# Patient Record
Sex: Female | Born: 1990 | Hispanic: No | Marital: Single | Smoking: Current every day smoker
Health system: Southern US, Community
[De-identification: ages and names within clinical notes are randomized; demographics above are authoritative.]

## PROBLEM LIST (undated history)

## (undated) DIAGNOSIS — F419 Anxiety disorder, unspecified: Secondary | ICD-10-CM

## (undated) DIAGNOSIS — J020 Streptococcal pharyngitis: Secondary | ICD-10-CM

## (undated) DIAGNOSIS — F1910 Other psychoactive substance abuse, uncomplicated: Secondary | ICD-10-CM

## (undated) DIAGNOSIS — A5901 Trichomonal vulvovaginitis: Secondary | ICD-10-CM

## (undated) DIAGNOSIS — N179 Acute kidney failure, unspecified: Secondary | ICD-10-CM

## (undated) HISTORY — PX: DILATION AND CURETTAGE OF UTERUS: SHX78

---
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
## 2009-11-25 ENCOUNTER — Emergency Department (HOSPITAL_COMMUNITY): Admission: EM | Admit: 2009-11-25 | Discharge: 2009-11-25 | Payer: Self-pay | Admitting: Emergency Medicine

## 2010-01-28 ENCOUNTER — Encounter (INDEPENDENT_AMBULATORY_CARE_PROVIDER_SITE_OTHER): Payer: Self-pay | Admitting: Cardiology

## 2010-01-28 ENCOUNTER — Ambulatory Visit (HOSPITAL_COMMUNITY): Admission: RE | Admit: 2010-01-28 | Discharge: 2010-01-28 | Payer: Self-pay | Admitting: Cardiology

## 2010-06-26 ENCOUNTER — Inpatient Hospital Stay (HOSPITAL_COMMUNITY)
Admission: AD | Admit: 2010-06-26 | Discharge: 2010-06-26 | Payer: Self-pay | Source: Home / Self Care | Attending: Obstetrics | Admitting: Obstetrics

## 2010-08-01 ENCOUNTER — Inpatient Hospital Stay (HOSPITAL_COMMUNITY)
Admission: AD | Admit: 2010-08-01 | Discharge: 2010-08-01 | Payer: Self-pay | Source: Home / Self Care | Admitting: Obstetrics

## 2010-08-15 ENCOUNTER — Inpatient Hospital Stay (HOSPITAL_COMMUNITY)
Admission: AD | Admit: 2010-08-15 | Discharge: 2010-08-17 | DRG: 774 | Disposition: A | Discharge status: Home / Self Care | Payer: Medicaid Other | Source: Ambulatory Visit | Attending: Obstetrics | Admitting: Obstetrics

## 2010-08-15 LAB — CBC
HCT: 32.6 % — ABNORMAL LOW (ref 36.0–46.0)
Hemoglobin: 11.1 g/dL — ABNORMAL LOW (ref 12.0–15.0)
MCHC: 34 g/dL (ref 30.0–36.0)
MCV: 89.8 fL (ref 78.0–100.0)
Platelets: 208 10*3/uL (ref 150–400)
RBC: 3.63 MIL/uL — ABNORMAL LOW (ref 3.87–5.11)
RDW: 13 % (ref 11.5–15.5)
WBC: 12.3 10*3/uL — ABNORMAL HIGH (ref 4.0–10.5)

## 2010-08-15 LAB — RPR: RPR Ser Ql: NONREACTIVE

## 2010-08-16 LAB — CBC
MCH: 31.2 pg (ref 26.0–34.0)
MCHC: 34.1 g/dL (ref 30.0–36.0)
MCV: 91.4 fL (ref 78.0–100.0)
Platelets: 120 10*3/uL — ABNORMAL LOW (ref 150–400)
RBC: 3.59 MIL/uL — ABNORMAL LOW (ref 3.87–5.11)
RDW: 13.1 % (ref 11.5–15.5)
WBC: 13.3 10*3/uL — ABNORMAL HIGH (ref 4.0–10.5)

## 2010-09-29 LAB — URINE CULTURE

## 2010-09-29 LAB — URINALYSIS, ROUTINE W REFLEX MICROSCOPIC
Glucose, UA: NEGATIVE mg/dL
Ketones, ur: 15 mg/dL — AB
Protein, ur: 30 mg/dL — AB
pH: 5.5 (ref 5.0–8.0)

## 2010-09-29 LAB — POCT I-STAT, CHEM 8
BUN: 9 mg/dL (ref 6–23)
Creatinine, Ser: 0.6 mg/dL (ref 0.4–1.2)
Hemoglobin: 15.3 g/dL — ABNORMAL HIGH (ref 12.0–15.0)
Potassium: 3.5 mEq/L (ref 3.5–5.1)
Sodium: 142 mEq/L (ref 135–145)
TCO2: 21 mmol/L (ref 0–100)

## 2010-09-29 LAB — HEPATIC FUNCTION PANEL
AST: 14 U/L (ref 0–37)
Albumin: 4 g/dL (ref 3.5–5.2)
Bilirubin, Direct: 0.1 mg/dL (ref 0.0–0.3)
Total Bilirubin: 0.9 mg/dL (ref 0.3–1.2)

## 2010-09-29 LAB — LIPASE, BLOOD: Lipase: 25 U/L (ref 11–59)

## 2010-09-29 LAB — POCT PREGNANCY, URINE: Preg Test, Ur: NEGATIVE

## 2010-09-29 LAB — URINE MICROSCOPIC-ADD ON

## 2010-10-23 LAB — CBC
HCT: 33.4 % — ABNORMAL LOW (ref 36.0–46.0)
Hemoglobin: 11.3 g/dL — ABNORMAL LOW (ref 12.0–15.0)
MCHC: 34 g/dL (ref 30.0–36.0)
MCV: 92.5 fL (ref 78.0–100.0)
Platelets: 170 10*3/uL (ref 150–400)
RDW: 13.2 % (ref 11.5–15.5)

## 2010-10-28 LAB — CBC
Hemoglobin: 12.7 g/dL (ref 12.0–15.0)
MCHC: 33.5 g/dL (ref 30.0–36.0)
Platelets: 205 10*3/uL (ref 150–400)
RDW: 13.1 % (ref 11.5–15.5)

## 2010-10-28 LAB — RPR: RPR Ser Ql: NONREACTIVE

## 2010-10-28 LAB — COMPREHENSIVE METABOLIC PANEL
ALT: 14 U/L (ref 0–35)
AST: 27 U/L (ref 0–37)
Albumin: 2.8 g/dL — ABNORMAL LOW (ref 3.5–5.2)
CO2: 26 mEq/L (ref 19–32)
Calcium: 11.3 mg/dL — ABNORMAL HIGH (ref 8.4–10.5)
Creatinine, Ser: 0.68 mg/dL (ref 0.4–1.2)
GFR calc Af Amer: >60 mL/min (ref >60)
GFR calc non Af Amer: >60 mL/min (ref >60)
Sodium: 137 mEq/L (ref 135–145)
Total Protein: 5.8 g/dL — ABNORMAL LOW (ref 6.0–8.3)

## 2010-10-28 LAB — LACTATE DEHYDROGENASE: LDH: 163 U/L (ref 94–250)

## 2010-11-25 NOTE — H&P (Signed)
[name], [name]              ACCOUNT NO.:  [id number]   MEDICAL RECORD NO.:  [id number]          PATIENT TYPE:  INP   LOCATION:  9147                          FACILITY:  [facility]   PHYSICIAN:  Roseanna Rainbow, M.D.DATE OF BIRTH:  1990/07/22   DATE OF ADMISSION:  09/09/2008  DATE OF DISCHARGE:                              HISTORY & PHYSICAL   CHIEF COMPLAINT:  The patient is an 20 year old, para 1 with an  estimated date of confinement of October 04, 2008, with an intrauterine  pregnancy at 25 plus weeks' complaining rupture of membranes.   HISTORY OF PRESENT ILLNESS:  Please see the above.  She reports rupture  of membranes for clear fluid prior to presentation.  She also complains  of lower back pain.   SOCIAL HISTORY:  She is single.  She has a history of tobacco use.   ALLERGIES:  No known drug allergies.   PAST GYNECOLOGIC HISTORY:  There is a history of cervical dysplasia and  cervical conization.   PAST OBSTETRICAL HISTORY:  There is a history of a spontaneous abortion  in 2006.  She was delivered of a live born female at 65 weeks, weight was  5 pounds 2 ounces, no complications.   PAST MEDICAL HISTORY:  She denies past surgical history, please see the  above.   FAMILY HISTORY:  Noncontributory.   PRENATAL LABS:  Hemoglobin 13.5, hematocrit 37.4, and platelets 253,000.  Blood type is O positive, antibody screen negative, RPR nonreactive,  rubella immune.  Hepatitis B surface antigen negative, HIV nonreactive.  Pap smear negative.  GC probe negative.  Chlamydia probe negative.  Quad  screen normal, 1-hour GTT 77, GBS negative on August 18, 2008.  Ultrasound at 19 weeks anterior placenta, no previa.  An ultrasound at  30 weeks 5 days was performed for growth and the amniotic fluid index  was 27.4.  Per the patient, on a subsequent study the polyhydramnios had  resolved.   REVIEW OF SYSTEMS:  NEUROLOGIC:  She denies any headache or visual  disturbances.  GI:  She  does complain of heartburn.  GU:  Please see the  above.   PHYSICAL EXAMINATION:  VITAL SIGNS:  Blood pressures 120s-150s/80s-100s,  fetal heart tracing reassuring.  Tocodynamometer, irregular uterine  contractions.  GENERAL:  No apparent distress.  ABDOMEN:  Gravid.  Sterile vaginal exam per the RN.  The cervix is 1-2  cm dilated and 50% effaced.   ASSESSMENT:  Primipara at 54 plus weeks' with preterm premature rupture  of membranes, elevated blood pressures, and fetal heart tracing  consistent with fetal well being.   PLAN:  Admission check PIH panel.  Follow blood pressures and follow for  symptoms consistent with severe PIH.  Ultrasound to confirm  presentation. Likely augmentation of labor with low-dose Pitocin per  protocol.       Roseanna Rainbow, M.D.  Electronically Signed     LAJ/MEDQ  D:  09/09/2008  T:  09/09/2008  Job:  [id number]   cc:   Kathreen Cosier, M.D.  Fax: [phone]

## 2010-11-25 NOTE — Op Note (Signed)
NAMEMarland Kitchen  Monica, Monica              ACCOUNT NO.:  [id number]   MEDICAL RECORD NO.:  [id number]          PATIENT TYPE:  AMB   LOCATION:  SDC                           FACILITY:  [facility]   PHYSICIAN:  Kathreen Cosier, M.D.DATE OF BIRTH:  08-15-1990   DATE OF PROCEDURE:  04/06/2007  DATE OF DISCHARGE:                               OPERATIVE REPORT   PREOPERATIVE DIAGNOSIS:  Severe dysplasia of the cervix.   Under general anesthesia, the patient in the lithotomy position,  perineum and vagina prepped and draped, bladder emptied with a straight  catheter.  Bimanual exam revealed uterus to be normal size, negative  adnexa.  Weighted speculum placed in the vagina.  The cervix was grasped  at 9 o'clock and hemostatic suture of #1 chromic placed on the lateral  aspect of the cervix at the 9 and 3 o'clock.  Then a cold knife cone  done in the usual manner and hemostatic sutures around the cervix of #1  chromic.  The cervical canal was patent and the endometrial cavity  sounded 9 cm.  The patient tolerated procedure well, taken to recovery  room in good condition.           ______________________________  Kathreen Cosier, M.D.     BAM/MEDQ  D:  04/06/2007  T:  04/06/2007  Job:  [id number]

## 2011-04-23 LAB — CBC
Hemoglobin: 13.6
MCHC: 34.1
MCV: 89.1
RBC: 4.47
WBC: 8.1

## 2011-04-23 LAB — DIFFERENTIAL
Eosinophils Absolute: 0.2
Lymphs Abs: 2.7
Monocytes Absolute: 0.6
Monocytes Relative: 8
Neutrophils Relative %: 56

## 2011-06-21 ENCOUNTER — Encounter: Payer: Self-pay | Admitting: *Deleted

## 2011-06-21 ENCOUNTER — Emergency Department (HOSPITAL_COMMUNITY)
Admission: EM | Admit: 2011-06-21 | Discharge: 2011-06-21 | Disposition: A | Discharge status: Home / Self Care | Payer: Self-pay | Attending: Emergency Medicine | Admitting: Emergency Medicine

## 2011-06-21 DIAGNOSIS — L0390 Cellulitis, unspecified: Secondary | ICD-10-CM

## 2011-06-21 DIAGNOSIS — L0291 Cutaneous abscess, unspecified: Secondary | ICD-10-CM | POA: Insufficient documentation

## 2011-06-21 DIAGNOSIS — L02419 Cutaneous abscess of limb, unspecified: Secondary | ICD-10-CM | POA: Insufficient documentation

## 2011-06-21 DIAGNOSIS — L03119 Cellulitis of unspecified part of limb: Secondary | ICD-10-CM | POA: Insufficient documentation

## 2011-06-21 MED ORDER — LIDOCAINE-EPINEPHRINE (PF) 1 %-1:200000 IJ SOLN
INTRAMUSCULAR | Status: AC
  Administered 2011-06-21: 3 mL
  Filled 2011-06-21: qty 10

## 2011-06-21 MED ORDER — IBUPROFEN 800 MG PO TABS: 800.0000 mg | ORAL_TABLET | Freq: Three times a day (TID) | ORAL | Status: AC | PRN

## 2011-06-21 MED ORDER — DOXYCYCLINE HYCLATE 100 MG PO CAPS: 100.0000 mg | ORAL_CAPSULE | Freq: Two times a day (BID) | ORAL | Status: AC

## 2011-06-21 MED ORDER — CEPHALEXIN 500 MG PO CAPS: 500.0000 mg | ORAL_CAPSULE | Freq: Four times a day (QID) | ORAL | Status: AC

## 2011-06-21 NOTE — ED Provider Notes (Signed)
History     CSN: [id number] Arrival date & time: 06/21/2011 11:11 AM   First MD Initiated Contact with Patient 06/21/11 1125      Chief Complaint  Patient presents with  . Recurrent Skin Infections    boil to right inner thigh    (Consider location/radiation/quality/duration/timing/severity/associated sxs/prior treatment) HPI Comments: Patient with increasing swelling and redness of right inner thigh for the past 3-4 days consistent with a boil. Patient denies draining. Patient denies fever. The treatments prior to arrival. She has had similar symptoms in past which have resolved spontaneously, however this is worse than previous.  Patient is a 20 y.o. female presenting with abscess. The history is provided by the patient.  Abscess  This is a new problem. The current episode started less than one week ago. The onset was gradual. The problem occurs occasionally. The problem has been gradually worsening. The abscess is present on the right upper leg. The problem is moderate. The abscess is characterized by redness and swelling. Pertinent negatives include no fever and no vomiting.    History reviewed. No pertinent past medical history.  History reviewed. No pertinent past surgical history.  No family history on file.  History  Substance Use Topics  . Smoking status: Current Everyday Smoker  . Smokeless tobacco: Not on file  . Alcohol Use: Yes    OB History    Grav Para Term Preterm Abortions TAB SAB Ect Mult Living                  Review of Systems  Constitutional: Negative for fever and chills.  Gastrointestinal: Negative for nausea and vomiting.  Musculoskeletal: Negative for myalgias.  Skin: Negative for color change, rash and wound.       Positive for abscess.    Allergies  Review of patient's allergies indicates no known allergies.  Home Medications   Current Outpatient Rx  Name Route Sig Dispense Refill  . CEPHALEXIN 500 MG PO CAPS Oral Take 1 capsule (500  mg total) by mouth 4 (four) times daily. 28 capsule 0  . DOXYCYCLINE HYCLATE 100 MG PO CAPS Oral Take 1 capsule (100 mg total) by mouth 2 (two) times daily. 14 capsule 0  . IBUPROFEN 800 MG PO TABS Oral Take 1 tablet (800 mg total) by mouth every 8 (eight) hours as needed for pain. 15 tablet 0    BP 140/73  Pulse 62  Temp(Src) 98.3 F (36.8 C) (Oral)  Resp 18  SpO2 95%  Physical Exam  Nursing note and vitals reviewed. Constitutional: She is oriented to person, place, and time. She appears well-developed and well-nourished.  HENT:  Head: Normocephalic and atraumatic.  Eyes: Pupils are equal, round, and reactive to light. Right eye exhibits no discharge. Left eye exhibits no discharge.  Neck: Normal range of motion. Neck supple.  Musculoskeletal: Normal range of motion. She exhibits tenderness.       Patient 3 cm area of induration of right medial thigh consistent with abscess. There is surrounding redness consistent with related cellulitis. The abscess is nondraining.  Neurological: She is alert and oriented to person, place, and time.  Skin: Skin is warm and dry. No rash noted. There is erythema.  Psychiatric: She has a normal mood and affect.    ED Course  Procedures (including critical care time)   Labs Reviewed  WOUND CULTURE   No results found.   1. Abscess   2. Cellulitis     1:05 PM patient seen and  examined. Informed of signs and symptoms to return including worsening pain, redness, swelling, or fever. Patient urged to return in 2 days for packing removal and wound recheck in areas not completely resolved. Counseled on wound care. Patient verbalizes understanding and agrees with plan.  INCISION AND DRAINAGE Performed by: Carolee Rota Consent: Verbal consent obtained. Risks and benefits: risks, benefits and alternatives were discussed Type: abscess  Body area: right medial thigh  Anesthesia: local infiltration  Local anesthetic: lidocaine 2% with  epinephrine  Anesthetic total: 3 ml  Complexity: complex Blunt dissection to break up loculations  Drainage: purulent  Drainage amount: moderate  Packing material: 1/4 in iodoform gauze  Patient tolerance: Patient tolerated the procedure well with no immediate complications.      MDM  Patient with medial thigh abscess, status post incision and drainage. Culture sent. Antibiotics indicated due to associated cellulitis.        Eustace Moore [place], [place] 06/21/11 1306

## 2011-06-22 NOTE — ED Provider Notes (Signed)
Evaluation and management procedures were performed by the mid-level provider (PA/NP/CNM) under my supervision/collaboration. I was present and available during the ED course. Natalin Bible Y.   Deckard Stuber Y. Shyam Dawson, MD 06/22/11 1146 

## 2011-06-24 LAB — WOUND CULTURE
Gram Stain: NONE SEEN
Special Requests: NORMAL

## 2012-01-20 ENCOUNTER — Emergency Department (HOSPITAL_COMMUNITY)
Admission: EM | Admit: 2012-01-20 | Discharge: 2012-01-20 | Disposition: A | Discharge status: Home / Self Care | Payer: Self-pay | Attending: Emergency Medicine | Admitting: Emergency Medicine

## 2012-01-20 ENCOUNTER — Encounter (HOSPITAL_COMMUNITY): Payer: Self-pay | Admitting: Emergency Medicine

## 2012-01-20 DIAGNOSIS — J020 Streptococcal pharyngitis: Secondary | ICD-10-CM | POA: Insufficient documentation

## 2012-01-20 DIAGNOSIS — R599 Enlarged lymph nodes, unspecified: Secondary | ICD-10-CM | POA: Insufficient documentation

## 2012-01-20 DIAGNOSIS — F17200 Nicotine dependence, unspecified, uncomplicated: Secondary | ICD-10-CM | POA: Insufficient documentation

## 2012-01-20 HISTORY — DX: Streptococcal pharyngitis: J02.0

## 2012-01-20 MED ORDER — LIDOCAINE VISCOUS 2 % MT SOLN
20.0000 mL | Freq: Once | OROMUCOSAL | Status: AC
  Administered 2012-01-20: 20 mL via OROMUCOSAL
  Filled 2012-01-20: qty 20

## 2012-01-20 MED ORDER — DEXAMETHASONE 1 MG/ML PO CONC
10.0000 mg | Freq: Once | ORAL | Status: AC
  Administered 2012-01-20: 10 mg via ORAL
  Filled 2012-01-20: qty 10

## 2012-01-20 MED ORDER — PENICILLIN G BENZATHINE 1200000 UNIT/2ML IM SUSP
1.2000 10*6.[IU] | Freq: Once | INTRAMUSCULAR | Status: AC
  Administered 2012-01-20: 1.2 10*6.[IU] via INTRAMUSCULAR
  Filled 2012-01-20: qty 2

## 2012-01-20 NOTE — ED Provider Notes (Signed)
History     CSN: [id number]  Arrival date & time 01/20/12  2130   First MD Initiated Contact with Patient 01/20/12 [phone]      Chief Complaint  Patient presents with  . Sore Throat    HPI This otherwise well young female presents with one day of sore throat.  Her symptoms began insidiously yesterday.  Since onset symptoms have been worsening.  She notes pain is worse with swallowing.  She notes mild improvement with OTC medication.  She denies any concurrent dyspnea, chest pain, headache, cough, fevers, chills.  Past Medical History  Diagnosis Date  . Strep throat     No past surgical history on file.  No family history on file.  History  Substance Use Topics  . Smoking status: Current Everyday Smoker -- 0.5 packs/day  . Smokeless tobacco: Not on file  . Alcohol Use: Yes     socially,     OB History    Grav Para Term Preterm Abortions TAB SAB Ect Mult Living   6 3              Review of Systems  All other systems reviewed and are negative.    Allergies  Review of patient's allergies indicates no known allergies.  Home Medications  No current outpatient prescriptions on file.  BP 140/67  Pulse 77  Temp 97.8 F (36.6 C) (Oral)  Resp 18  Ht 5\' 5"  (1.651 m)  Wt 184 lb 4 oz (83.575 kg)  BMI 30.66 kg/m2  SpO2 100%  LMP 01/04/2012  Physical Exam  Nursing note and vitals reviewed. Constitutional: She is oriented to person, place, and time. She appears well-developed and well-nourished. No distress.  HENT:  Head: Normocephalic and atraumatic.  Nose: Nose normal.  Mouth/Throat: Uvula is midline and mucous membranes are normal. Oropharyngeal exudate present. No posterior oropharyngeal edema, posterior oropharyngeal erythema or tonsillar abscesses.  Eyes: Conjunctivae and EOM are normal.  Neck: Trachea normal. Neck supple. No rigidity. No edema present.  Cardiovascular: Normal rate and regular rhythm.   Pulmonary/Chest: Effort normal and breath sounds normal.  No respiratory distress.  Lymphadenopathy:       Head (left side): No submental, no submandibular, no tonsillar, no preauricular, no posterior auricular and no occipital adenopathy present.    She has cervical adenopathy.       Left cervical: Superficial cervical adenopathy present. No deep cervical and no posterior cervical adenopathy present.  Neurological: She is alert and oriented to person, place, and time. No cranial nerve deficit.  Skin: Skin is warm and dry.    ED Course  Procedures (including critical care time)  Labs Reviewed - No data to display No results found.   1. Strep pharyngitis       MDM  This otherwise well young female now presents with one day of sore throat.  On exam she has an exudative pharyngitis, with tender, palpable adenopathy.  Given the patient's denial of cough, her prior history of strep throat, the presence of 3 Centor criteria, the patient will be treated empirically for strep throat.  She was discharged in stable condition.    Gerhard Munch, MD 01/20/12 [phone]

## 2012-01-20 NOTE — ED Notes (Signed)
Sore throat started yesterday, hurts to swallow, left side worse than right. Hx strep throat. White spot on left tonsil

## 2012-01-20 NOTE — ED Notes (Signed)
Discharge instructions reviewed w/ pt., verbalizes understanding. No prescriptions provided at discharge. 

## 2012-03-14 ENCOUNTER — Emergency Department (HOSPITAL_COMMUNITY)
Admission: EM | Admit: 2012-03-14 | Discharge: 2012-03-14 | Disposition: A | Discharge status: Home / Self Care | Payer: Self-pay | Attending: Emergency Medicine | Admitting: Emergency Medicine

## 2012-03-14 ENCOUNTER — Encounter (HOSPITAL_COMMUNITY): Payer: Self-pay | Admitting: *Deleted

## 2012-03-14 DIAGNOSIS — F17200 Nicotine dependence, unspecified, uncomplicated: Secondary | ICD-10-CM | POA: Insufficient documentation

## 2012-03-14 DIAGNOSIS — R51 Headache: Secondary | ICD-10-CM | POA: Insufficient documentation

## 2012-03-14 MED ORDER — KETOROLAC TROMETHAMINE 60 MG/2ML IM SOLN
60.0000 mg | Freq: Once | INTRAMUSCULAR | Status: AC
  Administered 2012-03-14: 60 mg via INTRAMUSCULAR
  Filled 2012-03-14: qty 2

## 2012-03-14 MED ORDER — ACETAMINOPHEN 325 MG PO TABS
975.0000 mg | ORAL_TABLET | Freq: Once | ORAL | Status: AC
  Administered 2012-03-14: 975 mg via ORAL
  Filled 2012-03-14: qty 3

## 2012-03-14 NOTE — ED Provider Notes (Signed)
History     CSN: [id number]  Arrival date & time 03/14/12  1735   First MD Initiated Contact with Patient 03/14/12 1823      Chief Complaint  Patient presents with  . Headache    (Consider location/radiation/quality/duration/timing/severity/associated sxs/prior treatment) HPI Comments: Patient reports headache that began gradually two days ago.  Pain is located in the left forehead and occiput.  Pt has also had cold chills.  Denies fevers, neck pain or stiffness, cough, SOB, sore throat, ear pain, abdominal pain, N/V/D, urinary or vaginal symptoms, change in bowel habits, or rash.  Has taken Goody powders and naprosyn without relief.    The history is provided by the patient.    Past Medical History  Diagnosis Date  . Strep throat     History reviewed. No pertinent past surgical history.  No family history on file.  History  Substance Use Topics  . Smoking status: Current Everyday Smoker -- 0.5 packs/day  . Smokeless tobacco: Not on file  . Alcohol Use: Yes     socially,     OB History    Grav Para Term Preterm Abortions TAB SAB Ect Mult Living   6 3              Review of Systems  Constitutional: Positive for chills. Negative for fever.  HENT: Negative for ear pain, congestion, sore throat, rhinorrhea, trouble swallowing and sinus pressure.   Respiratory: Negative for cough, shortness of breath and wheezing.   Gastrointestinal: Negative for nausea, vomiting, abdominal pain, diarrhea and constipation.  Genitourinary: Negative for dysuria, urgency, frequency, vaginal bleeding, vaginal discharge and menstrual problem.  Neurological: Positive for headaches. Negative for weakness and numbness.    Allergies  Review of patient's allergies indicates no known allergies.  Home Medications   Current Outpatient Rx  Name Route Sig Dispense Refill  . GOODY HEADACHE PO Oral Take 1 Package by mouth as needed. Headache    . NAPROXEN 250 MG PO TABS Oral Take 250 mg by mouth  as needed. Headache      Pulse 85  Temp 97.8 F (36.6 C) (Oral)  SpO2 99%  LMP 02/22/2012  Physical Exam  Nursing note and vitals reviewed. Constitutional: She appears well-developed and well-nourished. No distress.  HENT:  Head: Normocephalic and atraumatic.  Neck: Neck supple.  Cardiovascular: Normal rate and regular rhythm.   Pulmonary/Chest: Effort normal and breath sounds normal. No respiratory distress. She has no wheezes. She has no rales.  Abdominal: Soft. She exhibits no distension. There is no tenderness. There is no rebound and no guarding.  Neurological: She is alert. She has normal strength. No cranial nerve deficit or sensory deficit. She exhibits normal muscle tone. Coordination and gait normal. GCS eye subscore is 4. GCS verbal subscore is 5. GCS motor subscore is 6.       CN II-XII intact, EOMs intact, no pronator drift, grip strengths equal bilaterally; strength 5/5 in all extremities, sensation intact in all extremities; finger to nose, heel to shin, rapid alternating movements normal; gait is normal.     Skin: She is not diaphoretic.    ED Course  Procedures (including critical care time)  Labs Reviewed - No data to display No results found.   1. Headache       MDM  Pt with mild headache - laughing and joking throughout exam, appears very comfortable.  Pt given toradol and tylenol with relief. Likely tension or other benign headache.  No red flags for  headache.  D/C home with PCP follow up.  Discussed diagnosis and home care plan with patient.  Pt given return precautions.  Pt verbalizes understanding and agrees with plan.           [place], [place] 03/14/12 2033

## 2012-03-14 NOTE — ED Provider Notes (Signed)
Medical screening examination/treatment/procedure(s) were performed by non-physician practitioner and as supervising physician I was immediately available for consultation/collaboration.   Benny Lennert, MD 03/14/12 2232

## 2012-03-14 NOTE — ED Notes (Signed)
MD at bedside. 

## 2012-03-14 NOTE — ED Notes (Signed)
Ppt has had headache since Sat, also co of cough making symptoms worse, no N/V pt laughing and talking on cell phone during assessment

## 2012-03-14 NOTE — ED Notes (Signed)
Family at bedside. 

## 2012-04-23 ENCOUNTER — Emergency Department (HOSPITAL_BASED_OUTPATIENT_CLINIC_OR_DEPARTMENT_OTHER)
Admission: EM | Admit: 2012-04-23 | Discharge: 2012-04-23 | Disposition: A | Discharge status: Home / Self Care | Payer: Self-pay | Attending: Emergency Medicine | Admitting: Emergency Medicine

## 2012-04-23 ENCOUNTER — Encounter (HOSPITAL_BASED_OUTPATIENT_CLINIC_OR_DEPARTMENT_OTHER): Payer: Self-pay | Admitting: Emergency Medicine

## 2012-04-23 DIAGNOSIS — B9689 Other specified bacterial agents as the cause of diseases classified elsewhere: Secondary | ICD-10-CM | POA: Insufficient documentation

## 2012-04-23 DIAGNOSIS — F17200 Nicotine dependence, unspecified, uncomplicated: Secondary | ICD-10-CM | POA: Insufficient documentation

## 2012-04-23 DIAGNOSIS — A599 Trichomoniasis, unspecified: Secondary | ICD-10-CM | POA: Insufficient documentation

## 2012-04-23 DIAGNOSIS — B3731 Acute candidiasis of vulva and vagina: Secondary | ICD-10-CM | POA: Insufficient documentation

## 2012-04-23 DIAGNOSIS — B373 Candidiasis of vulva and vagina: Secondary | ICD-10-CM | POA: Insufficient documentation

## 2012-04-23 DIAGNOSIS — N390 Urinary tract infection, site not specified: Secondary | ICD-10-CM | POA: Insufficient documentation

## 2012-04-23 DIAGNOSIS — N760 Acute vaginitis: Secondary | ICD-10-CM | POA: Insufficient documentation

## 2012-04-23 LAB — URINALYSIS, ROUTINE W REFLEX MICROSCOPIC
Glucose, UA: NEGATIVE mg/dL
Hgb urine dipstick: NEGATIVE
Specific Gravity, Urine: 1.022 (ref 1.005–1.030)

## 2012-04-23 LAB — PREGNANCY, URINE: Preg Test, Ur: NEGATIVE

## 2012-04-23 LAB — URINE MICROSCOPIC-ADD ON

## 2012-04-23 LAB — WET PREP, GENITAL

## 2012-04-23 MED ORDER — METRONIDAZOLE 500 MG PO TABS: 500.0000 mg | ORAL_TABLET | Freq: Two times a day (BID) | ORAL | Status: DC

## 2012-04-23 MED ORDER — FLUCONAZOLE 200 MG PO TABS: 200.0000 mg | ORAL_TABLET | Freq: Every day | ORAL | Status: AC

## 2012-04-23 MED ORDER — SULFAMETHOXAZOLE-TRIMETHOPRIM 800-160 MG PO TABS: 1.0000 | ORAL_TABLET | Freq: Two times a day (BID) | ORAL | Status: AC

## 2012-04-23 MED ORDER — METRONIDAZOLE 500 MG PO TABS
ORAL_TABLET | ORAL | Status: AC
  Filled 2012-04-23: qty 4

## 2012-04-23 MED ORDER — CEFTRIAXONE SODIUM 250 MG IJ SOLR
250.0000 mg | Freq: Once | INTRAMUSCULAR | Status: AC
  Administered 2012-04-23: 250 mg via INTRAMUSCULAR
  Filled 2012-04-23: qty 250

## 2012-04-23 MED ORDER — LIDOCAINE HCL (PF) 1 % IJ SOLN
INTRAMUSCULAR | Status: AC
  Administered 2012-04-23: 13:00:00
  Filled 2012-04-23: qty 5

## 2012-04-23 MED ORDER — AZITHROMYCIN 250 MG PO TABS
1000.0000 mg | ORAL_TABLET | Freq: Once | ORAL | Status: AC
  Administered 2012-04-23: 1000 mg via ORAL
  Filled 2012-04-23: qty 4

## 2012-04-23 NOTE — ED Notes (Addendum)
Pts boyfriend who is here to be seen for possible STD.  Pt wants to be checked for STD.  Pt denies any symptoms.

## 2012-04-23 NOTE — ED Provider Notes (Signed)
History     CSN: [id number]  Arrival date & time 04/23/12  1132   First MD Initiated Contact with Patient 04/23/12 1217      Chief Complaint  Patient presents with  . Exposure to STD    (Consider location/radiation/quality/duration/timing/severity/associated sxs/prior treatment) HPI Comments: Patient is a 21 year old female who presents with a request to be checked for STDs. She reports her partner having penile discharge for the past 2 days and she would like to make sure she does not have an STD. She denies current symptoms except for some occasional vaginal itching. She reports being monogamous with one partner. Her LMP was 1 week ago. She denies any abdominal pain, fever, NVD, vaginal discharge, abnormal vaginal bleeding, dysuria.    Past Medical History  Diagnosis Date  . Strep throat     No past surgical history on file.  No family history on file.  History  Substance Use Topics  . Smoking status: Current Every Day Smoker -- 0.5 packs/day  . Smokeless tobacco: Not on file  . Alcohol Use: Yes     socially,     OB History    Grav Para Term Preterm Abortions TAB SAB Ect Mult Living   6 3              Review of Systems  All other systems reviewed and are negative.    Allergies  Review of patient's allergies indicates no known allergies.  Home Medications  No current outpatient prescriptions on file.  BP 127/76  Pulse 75  Temp 98.3 F (36.8 C) (Oral)  Resp 16  SpO2 100%  LMP 04/16/2012  Physical Exam  Nursing note and vitals reviewed. Constitutional: She is oriented to person, place, and time. She appears well-developed and well-nourished. No distress.  HENT:  Head: Normocephalic and atraumatic.  Eyes: Conjunctivae normal are normal. No scleral icterus.  Neck: Normal range of motion. Neck supple.  Cardiovascular: Normal rate and regular rhythm.  Exam reveals no gallop and no friction rub.   No murmur heard. Pulmonary/Chest: Effort normal and  breath sounds normal. She has no wheezes. She has no rales. She exhibits no tenderness.  Abdominal: Soft. There is no tenderness.  Genitourinary:       Copious amount of white vaginal discharge in vaginal. No odor noted. No cervical motion tenderness.   Musculoskeletal: Normal range of motion.  Neurological: She is alert and oriented to person, place, and time. Coordination normal.       Speech is goal-oriented. Moves limbs without ataxia.   Skin: Skin is warm and dry. She is not diaphoretic.  Psychiatric: She has a normal mood and affect. Her behavior is normal.    ED Course  Procedures (including critical care time)  Labs Reviewed  URINALYSIS, ROUTINE W REFLEX MICROSCOPIC - Abnormal; Notable for the following:    APPearance CLOUDY (*)     Leukocytes, UA MODERATE (*)     All other components within normal limits  URINE MICROSCOPIC-ADD ON - Abnormal; Notable for the following:    Squamous Epithelial / LPF FEW (*)     Bacteria, UA MANY (*)     All other components within normal limits  WET PREP, GENITAL - Abnormal; Notable for the following:    Yeast Wet Prep HPF POC FEW (*)     Trich, Wet Prep MODERATE (*)     Clue Cells Wet Prep HPF POC TOO NUMEROUS TO COUNT (*)     WBC, Wet Prep  HPF POC TOO NUMEROUS TO COUNT (*)     All other components within normal limits  PREGNANCY, URINE  GC/CHLAMYDIA PROBE AMP, GENITAL   No results found.   1. UTI (urinary tract infection)   2. BV (bacterial vaginosis)   3. Trichimoniasis   4. Vaginal yeast infection       MDM  1:00 PM Patient's urinalysis shows UTI. Wet prep reveals yeast, bacterial vaginosis and trichomoniasis. I will treat patient for GC/Chlamydia, UTI, yeast, bacterial vaginosis and trichomoniasis. Patient is agreeable to plan. Her partner is here and will be treated as well.         Emilia Beck, PA-C 04/24/12 0016

## 2012-04-27 NOTE — ED Provider Notes (Signed)
Medical screening examination/treatment/procedure(s) were performed by non-physician practitioner and as supervising physician I was immediately available for consultation/collaboration.    Nelia Shi, MD 04/27/12 1153

## 2012-08-17 ENCOUNTER — Encounter: Payer: Self-pay | Admitting: Obstetrics & Gynecology

## 2012-08-17 ENCOUNTER — Inpatient Hospital Stay (HOSPITAL_COMMUNITY): Payer: Self-pay

## 2012-08-17 ENCOUNTER — Encounter (HOSPITAL_COMMUNITY): Payer: Self-pay

## 2012-08-17 ENCOUNTER — Inpatient Hospital Stay (HOSPITAL_COMMUNITY)
Admission: AD | Admit: 2012-08-17 | Discharge: 2012-08-17 | Disposition: A | Discharge status: Home / Self Care | Payer: Self-pay | Source: Ambulatory Visit | Attending: Obstetrics and Gynecology | Admitting: Obstetrics and Gynecology

## 2012-08-17 DIAGNOSIS — N938 Other specified abnormal uterine and vaginal bleeding: Secondary | ICD-10-CM | POA: Insufficient documentation

## 2012-08-17 DIAGNOSIS — N949 Unspecified condition associated with female genital organs and menstrual cycle: Secondary | ICD-10-CM | POA: Insufficient documentation

## 2012-08-17 DIAGNOSIS — N898 Other specified noninflammatory disorders of vagina: Secondary | ICD-10-CM

## 2012-08-17 DIAGNOSIS — N939 Abnormal uterine and vaginal bleeding, unspecified: Secondary | ICD-10-CM

## 2012-08-17 HISTORY — DX: Anxiety disorder, unspecified: F41.9

## 2012-08-17 LAB — URINALYSIS, ROUTINE W REFLEX MICROSCOPIC
Glucose, UA: NEGATIVE mg/dL
Nitrite: NEGATIVE
Protein, ur: NEGATIVE mg/dL
pH: 6.5 (ref 5.0–8.0)

## 2012-08-17 LAB — CBC
Hemoglobin: 11.3 g/dL — ABNORMAL LOW (ref 12.0–15.0)
MCH: 30.1 pg (ref 26.0–34.0)
MCHC: 32.8 g/dL (ref 30.0–36.0)
Platelets: 239 10*3/uL (ref 150–400)
RDW: 12.8 % (ref 11.5–15.5)

## 2012-08-17 LAB — HCG, QUANTITATIVE, PREGNANCY: hCG, Beta Chain, Quant, S: <1 m[IU]/mL (ref <5)

## 2012-08-17 LAB — URINE MICROSCOPIC-ADD ON

## 2012-08-17 LAB — WET PREP, GENITAL: Clue Cells Wet Prep HPF POC: NONE SEEN

## 2012-08-17 MED ORDER — NORGESTIMATE-ETH ESTRADIOL 0.25-35 MG-MCG PO TABS: 1.0000 | ORAL_TABLET | Freq: Every day | ORAL | Status: DC

## 2012-08-17 NOTE — MAU Note (Signed)
Pt states bled for 1.5-2 weeks post D&C, then stopped. Pt began birth control, has never stopped completely. Now on 2nd pack of bc pills. Did pass clots size of her hand. Now bleeding has lightened up, changing pad twice daily, changing for sanitary purposes. Mild cramping intermittently.

## 2012-08-17 NOTE — MAU Provider Note (Signed)
History     CSN: [id number]  Arrival date and time: 08/17/12 4098   First Provider Initiated Contact with Patient 08/17/12 1158      No chief complaint on file.  HPI [name] 22 y.o. Comes to MAU today with extended bleeding for more than 10 days while on contraceptive pills.  Had D&C in December for TAB.  Was not having a problem with bleeding and was on pills.  Then when she had a period at the end of her first pack of pills she has continued to keep bleeding.  Is halfway through second pack of pills.  Does not have insurance at this time so she is unable to see a private provider.   OB History    Grav Para Term Preterm Abortions TAB SAB Ect Mult Living   6 3 2 1 3 2 1   3       Past Medical History  Diagnosis Date  . Strep throat   . Preterm labor   . Anxiety     Past Surgical History  Procedure Date  . Dilation and curettage of uterus     Family History  Problem Relation Age of Onset  . Other Neg Hx     History  Substance Use Topics  . Smoking status: Current Every Day Smoker -- 0.5 packs/day  . Smokeless tobacco: Never Used  . Alcohol Use: Yes     Comment: socially,     Allergies: No Known Allergies  Prescriptions prior to admission  Medication Sig Dispense Refill  . norgestimate-ethinyl estradiol (ORTHO-CYCLEN,SPRINTEC,PREVIFEM) 0.25-35 MG-MCG tablet Take 1 tablet by mouth daily.        Review of Systems  Constitutional: Negative for fever.  Gastrointestinal: Negative for nausea, vomiting and abdominal pain.  Genitourinary: Negative for dysuria.       Vaginal bleeding   Physical Exam   Blood pressure 134/78, pulse 64, temperature 98.4 F (36.9 C), temperature source Oral, resp. rate 18, height 5' 3.75" (1.619 m), weight 73.483 kg (162 lb), last menstrual period 07/31/2012.  Physical Exam  Nursing note and vitals reviewed. Constitutional: She is oriented to person, place, and time. She appears well-developed and well-nourished. No  distress.  HENT:  Head: Normocephalic.  Eyes: EOM are normal.  Neck: Neck supple.  GI: Soft. There is no tenderness.  Genitourinary:       Speculum exam: Vagina - Mod amount of blood noted, no odor Cervix - Appearance consistent with previous [facility] biopsy.  No contact bleeding Bimanual exam: Cervix closed Uterus non tender, normal size Adnexa non tender, no masses bilaterally GC/Chlam, wet prep done Chaperone present for exam.  Musculoskeletal: Normal range of motion.  Neurological: She is alert and oriented to person, place, and time.  Skin: Skin is warm and dry.  Psychiatric: She has a normal mood and affect.    MAU Course  Procedures  MDM Clinical Data: Menorrhagia  TRANSABDOMINAL AND TRANSVAGINAL ULTRASOUND OF PELVIS  Technique: Both transabdominal and transvaginal ultrasound  examinations of the pelvis were performed. Transabdominal  technique was performed for global imaging of the pelvis including  uterus, ovaries, adnexal regions, and pelvic cul-de-sac.  It was necessary to proceed with endovaginal exam following the  transabdominal exam to visualize the endometrium.  Comparison: 09/09/2008  Findings:  Uterus: Anteverted, anteflexed. 10.0 x 6.5 x 4.8 cm. No focal  abnormality.  Endometrium: Inhomogeneous and mildly prominent measuring 1.3 cm.  Right ovary: 3.6 x 2.1 x 1.5 cm. Normal.  Left ovary: 3.3  x 2.0 x 1.7 cm. Normal.  Other Findings: Small free fluid noted in the cul-de-sac.  IMPRESSION:  Inhomogeneous endometrium, which could represent cyclical variation  or hyperplasia, although polyp or submucosal fibroid could have a  similar appearance and could account for the history of vaginal  bleeding. Repeat imaging during week following the patient's  menses is recommended for better visualization, or after withdrawal  bleeding if pharmacologic agents are provided. If there is  persistent endometrial prominence at that time, then further  imaging such as  sonohysterography for direct visualization could be  considered.  Original Report Authenticated By: Christiana Pellant, M.D.   Assessment and Plan  Abnormal vaginal bleeding  Plan Advised to take BCP BID x 7 days and skip placebo pills in current pack.  Then return to one pill q day as she has been taking. Expect call from GYN clinic for an appointment. Will give RX for 2 packs of Sprintec Message sent to GYN clinic for an appointment.  BURLESON,TERRI 08/17/2012, 12:43 PM

## 2012-08-17 NOTE — MAU Note (Signed)
Had an abortion in Dec. ([facility] in [place]) Bleeding stopped a few later, then stopped.  When period started- it hasn;t stopped, continues to bleed.  Was really heavy with a lot of clots 2 wks ago.  Now is more like a normal period.

## 2012-08-18 LAB — URINE CULTURE

## 2012-08-18 LAB — GC/CHLAMYDIA PROBE AMP
CT Probe RNA: NEGATIVE
GC Probe RNA: NEGATIVE

## 2012-08-18 NOTE — MAU Provider Note (Signed)
Attestation of Attending Supervision of Advanced Practitioner (CNM/NP): Evaluation and management procedures were performed by the Advanced Practitioner under my supervision and collaboration.  I have reviewed the Advanced Practitioner's note and chart, and I agree with the management and plan.  Isaiyah Feldhaus 08/18/2012 9:14 AM

## 2012-09-05 ENCOUNTER — Encounter: Payer: Medicaid Other | Admitting: Obstetrics & Gynecology

## 2012-09-29 ENCOUNTER — Emergency Department (HOSPITAL_COMMUNITY)
Admission: EM | Admit: 2012-09-29 | Discharge: 2012-09-29 | Disposition: A | Discharge status: Home / Self Care | Payer: Self-pay | Attending: Emergency Medicine | Admitting: Emergency Medicine

## 2012-09-29 ENCOUNTER — Emergency Department (HOSPITAL_COMMUNITY): Payer: Self-pay

## 2012-09-29 ENCOUNTER — Encounter (HOSPITAL_COMMUNITY): Payer: Self-pay | Admitting: *Deleted

## 2012-09-29 DIAGNOSIS — Z3202 Encounter for pregnancy test, result negative: Secondary | ICD-10-CM | POA: Insufficient documentation

## 2012-09-29 DIAGNOSIS — F430 Acute stress reaction: Secondary | ICD-10-CM | POA: Insufficient documentation

## 2012-09-29 DIAGNOSIS — R450 Nervousness: Secondary | ICD-10-CM | POA: Insufficient documentation

## 2012-09-29 DIAGNOSIS — F17200 Nicotine dependence, unspecified, uncomplicated: Secondary | ICD-10-CM | POA: Insufficient documentation

## 2012-09-29 DIAGNOSIS — F1210 Cannabis abuse, uncomplicated: Secondary | ICD-10-CM | POA: Insufficient documentation

## 2012-09-29 DIAGNOSIS — R002 Palpitations: Secondary | ICD-10-CM | POA: Insufficient documentation

## 2012-09-29 DIAGNOSIS — R6883 Chills (without fever): Secondary | ICD-10-CM | POA: Insufficient documentation

## 2012-09-29 DIAGNOSIS — R0602 Shortness of breath: Secondary | ICD-10-CM | POA: Insufficient documentation

## 2012-09-29 DIAGNOSIS — F419 Anxiety disorder, unspecified: Secondary | ICD-10-CM

## 2012-09-29 DIAGNOSIS — R079 Chest pain, unspecified: Secondary | ICD-10-CM | POA: Insufficient documentation

## 2012-09-29 DIAGNOSIS — F410 Panic disorder [episodic paroxysmal anxiety] without agoraphobia: Secondary | ICD-10-CM

## 2012-09-29 DIAGNOSIS — Z8751 Personal history of pre-term labor: Secondary | ICD-10-CM | POA: Insufficient documentation

## 2012-09-29 DIAGNOSIS — Z8619 Personal history of other infectious and parasitic diseases: Secondary | ICD-10-CM | POA: Insufficient documentation

## 2012-09-29 LAB — URINE MICROSCOPIC-ADD ON

## 2012-09-29 LAB — POCT I-STAT, CHEM 8
HCT: 42 % (ref 36.0–46.0)
Hemoglobin: 14.3 g/dL (ref 12.0–15.0)
Potassium: 3.9 mEq/L (ref 3.5–5.1)
Sodium: 139 mEq/L (ref 135–145)

## 2012-09-29 LAB — URINALYSIS, ROUTINE W REFLEX MICROSCOPIC
Bilirubin Urine: NEGATIVE
Glucose, UA: NEGATIVE mg/dL
Ketones, ur: NEGATIVE mg/dL
Nitrite: NEGATIVE
Specific Gravity, Urine: 1.013 (ref 1.005–1.030)
pH: 6 (ref 5.0–8.0)

## 2012-09-29 NOTE — ED Provider Notes (Signed)
Medical screening examination/treatment/procedure(s) were performed by non-physician practitioner and as supervising physician I was immediately available for consultation/collaboration.    Nihal Doan R Breckin Savannah, MD 09/29/12 2350 

## 2012-09-29 NOTE — ED Provider Notes (Signed)
History  This chart was scribed for non-physician practitioner Heywood Bene, PA-C working with Celene Kras, MD, by Candelaria Stagers, ED Scribe. This patient was seen in room WTR7/WTR7 and the patient's care was started at 6:32 PM   CSN: [id number]  Arrival date & time 09/29/12  1646   First MD Initiated Contact with Patient 09/29/12 1832      Chief Complaint  Patient presents with  . Anxiety     The history is provided by the patient. No language interpreter was used.   [name] is a 22 y.o. female who presents to the Emergency Department complaining of SOB that started earlier today after she experienced a panic attack.  Pt has a h/o anxiety and panic attacks and states today's panic attack was worse than normal.  She reports palpitation, shills, SOB, and chest pain during the panic attack.  She reports that she laid down to sleep and felt like she was holding her breath.  She was afraid to sleep stating that she was worried she would not wake up.  Pt reports her symptoms have improved somewhat since then.  Pt denies SOB, fever, leg swelling, or dysuria.  Pt reports she frequently experiences sharp chest pains and has had an echo and xray with negative findings.  Pt denies recent ill contacts or long road trips.  Pt took a breathing treatment for the SOB with no relief.  She takes birth control, Sprintec.     Past Medical History  Diagnosis Date  . Strep throat   . Preterm labor   . Anxiety     Past Surgical History  Procedure Laterality Date  . Dilation and curettage of uterus      Family History  Problem Relation Age of Onset  . Other Neg Hx     History  Substance Use Topics  . Smoking status: Current Every Day Smoker -- 0.50 packs/day  . Smokeless tobacco: Never Used  . Alcohol Use: Yes     Comment: socially,     OB History   Grav Para Term Preterm Abortions TAB SAB Ect Mult Living   6 3 2 1 3 2 1   3       Review of Systems  Respiratory: Positive  for chest tightness and shortness of breath.   Cardiovascular: Positive for chest pain.  Psychiatric/Behavioral: The patient is nervous/anxious.   All other systems reviewed and are negative.    Allergies  Review of patient's allergies indicates no known allergies.  Home Medications   Current Outpatient Rx  Name  Route  Sig  Dispense  Refill  . ibuprofen (ADVIL,MOTRIN) 200 MG tablet   Oral   Take 800 mg by mouth every 6 (six) hours as needed for pain.         . norgestimate-ethinyl estradiol (ORTHO-CYCLEN,SPRINTEC,PREVIFEM) 0.25-35 MG-MCG tablet   Oral   Take 1 tablet by mouth daily.   1 Package   1     BP 129/72  Pulse 53  Temp(Src) 98.6 F (37 C) (Oral)  Resp 16  SpO2 100%  Physical Exam  Nursing note and vitals reviewed. Constitutional: She is oriented to person, place, and time. She appears well-developed and well-nourished. No distress.  HENT:  Head: Normocephalic and atraumatic.  Right Ear: Tympanic membrane, external ear and ear canal normal. Tympanic membrane is not erythematous.  Left Ear: Tympanic membrane, external ear and ear canal normal. Tympanic membrane is not erythematous.  Nose: Nose normal. No mucosal edema or  rhinorrhea.  Mouth/Throat: Uvula is midline, oropharynx is clear and moist and mucous membranes are normal. Mucous membranes are not dry and not cyanotic. No oropharyngeal exudate, posterior oropharyngeal edema, posterior oropharyngeal erythema or tonsillar abscesses.  Eyes: Conjunctivae are normal. No scleral icterus.  Neck: Normal range of motion. Neck supple.  Cardiovascular: Normal rate, regular rhythm, normal heart sounds and intact distal pulses.  Exam reveals no gallop and no friction rub.   No murmur heard. Pulmonary/Chest: Effort normal and breath sounds normal. No respiratory distress. She has no wheezes.  Course breath sounds in the bases. No rhonchi   Abdominal: Soft. Bowel sounds are normal. She exhibits no mass. There is no  tenderness. There is no rebound and no guarding.  Musculoskeletal: Normal range of motion. She exhibits no edema.  Neurological: She is alert and oriented to person, place, and time. She exhibits normal muscle tone. Coordination normal.  Speech is clear and goal oriented Moves extremities without ataxia  Skin: Skin is warm and dry. No rash noted. She is not diaphoretic. No erythema.  Psychiatric: She has a normal mood and affect.    ED Course  Procedures   DIAGNOSTIC STUDIES: Oxygen Saturation is 100% on room air, normal by my interpretation.    COORDINATION OF CARE:  6:37 PM Discussed course of care with pt which includes chest xray and blood work.  Pt understands and agrees.    Labs Reviewed  URINALYSIS, ROUTINE W REFLEX MICROSCOPIC - Abnormal; Notable for the following:    APPearance CLOUDY (*)    Hgb urine dipstick LARGE (*)    Leukocytes, UA TRACE (*)    All other components within normal limits  URINE MICROSCOPIC-ADD ON - Abnormal; Notable for the following:    Squamous Epithelial / LPF FEW (*)    Bacteria, UA FEW (*)    All other components within normal limits  POCT I-STAT, CHEM 8 - Abnormal; Notable for the following:    Calcium, Ion 1.08 (*)    All other components within normal limits  URINE CULTURE  POCT PREGNANCY, URINE   Dg Chest 2 View  09/29/2012  *RADIOLOGY REPORT*  Clinical Data: Short of breath, smoker  CHEST - 2 VIEW  Comparison: Chest radiograph 09/14/2008  Findings: Normal mediastinum and cardiac silhouette.  Normal pulmonary  vasculature.  No evidence of effusion, infiltrate, or pneumothorax.  No acute bony abnormality.  IMPRESSION: Normal chest radiograph.   Original Report Authenticated By: Genevive Bi, M.D.    ECG:  Date: 09/29/2012  Rate: 70  Rhythm: normal sinus rhythm  QRS Axis: normal  Intervals: normal  ST/T Wave abnormalities: normal  Conduction Disutrbances:none  Narrative Interpretation: nonischemic, no old for comparison  Old EKG  Reviewed: none available    1. Anxiety   2. Panic attack as reaction to stress       MDM  [name] presents with symptoms consistent with anxiety and a Hx of same and she states the symptoms today were similar to previous episodes.  The patient is resting comfortably, in no apparent distress and asymptomatic.  Labs, ECG and vital signs reviewed.  No exophthalmos, pregnancy test negative, no signs of UTI.  Stress reducing mechanisms discussed including caffeine intake.  Patient has been referred to psychiatric services for follow-up.     I personally performed the services described in this documentation, which was scribed in my presence. The recorded information has been reviewed and is accurate.        Dierdre Forth, PA-C  09/29/12 1952  Demarious Kapur, PA-C 09/29/12 1954

## 2012-09-29 NOTE — ED Notes (Signed)
Pt reports when she was at home she was trying to go to sleep and felt like "I was holding my own breath when I was trying to go to sleep, I was scared I wouldn't wake up and my whole body was numb." states "I feel fine now." Reports hx of anxiety and panic attacks but "this one was really bad."

## 2012-09-30 LAB — URINE CULTURE

## 2014-05-14 ENCOUNTER — Encounter (HOSPITAL_COMMUNITY): Payer: Self-pay | Admitting: *Deleted

## 2014-07-04 LAB — OB RESULTS CONSOLE ANTIBODY SCREEN: Antibody Screen: NEGATIVE

## 2014-07-04 LAB — OB RESULTS CONSOLE ABO/RH: RH TYPE: POSITIVE

## 2014-07-04 LAB — OB RESULTS CONSOLE HIV ANTIBODY (ROUTINE TESTING): HIV: NONREACTIVE

## 2014-07-04 LAB — OB RESULTS CONSOLE RPR: RPR: NONREACTIVE

## 2014-07-04 LAB — OB RESULTS CONSOLE GC/CHLAMYDIA
CHLAMYDIA, DNA PROBE: NEGATIVE
Gonorrhea: NEGATIVE

## 2014-07-04 LAB — OB RESULTS CONSOLE HEPATITIS B SURFACE ANTIGEN: Hepatitis B Surface Ag: NEGATIVE

## 2014-07-04 LAB — OB RESULTS CONSOLE RUBELLA ANTIBODY, IGM: Rubella: IMMUNE

## 2014-07-13 NOTE — L&D Delivery Note (Signed)
Delivery Note At 8:02 AM a viable female was delivered via Vaginal, Spontaneous Delivery (Presentation: Left Occiput Anterior).  APGAR: 9, 9; weight  .   Placenta status: Intact, Spontaneous.  Cord: 3 vessels with the following complications: None.  Cord pH: none  Anesthesia: None  Episiotomy: None Lacerations: None Suture Repair: none Est. Blood Loss (mL): 600  Mom to postpartum.  Baby to Couplet care / Skin to Skin.  HARPER,CHARLES A 01/27/2015, 9:07 AM

## 2014-08-02 ENCOUNTER — Inpatient Hospital Stay (HOSPITAL_COMMUNITY): Admission: AD | Admit: 2014-08-02 | Payer: Self-pay | Source: Ambulatory Visit | Admitting: Obstetrics

## 2015-01-09 ENCOUNTER — Encounter (HOSPITAL_COMMUNITY): Payer: Self-pay | Admitting: *Deleted

## 2015-01-09 ENCOUNTER — Inpatient Hospital Stay (HOSPITAL_COMMUNITY)
Admission: AD | Admit: 2015-01-09 | Discharge: 2015-01-09 | Disposition: A | Discharge status: Home / Self Care | Payer: Medicaid Other | Source: Ambulatory Visit | Attending: Obstetrics | Admitting: Obstetrics

## 2015-01-09 ENCOUNTER — Other Ambulatory Visit: Payer: Self-pay | Admitting: Advanced Practice Midwife

## 2015-01-09 DIAGNOSIS — R030 Elevated blood-pressure reading, without diagnosis of hypertension: Secondary | ICD-10-CM | POA: Diagnosis present

## 2015-01-09 DIAGNOSIS — Z3A36 36 weeks gestation of pregnancy: Secondary | ICD-10-CM | POA: Diagnosis not present

## 2015-01-09 DIAGNOSIS — F17210 Nicotine dependence, cigarettes, uncomplicated: Secondary | ICD-10-CM | POA: Insufficient documentation

## 2015-01-09 DIAGNOSIS — O99333 Smoking (tobacco) complicating pregnancy, third trimester: Secondary | ICD-10-CM | POA: Insufficient documentation

## 2015-01-09 DIAGNOSIS — E876 Hypokalemia: Secondary | ICD-10-CM

## 2015-01-09 DIAGNOSIS — O133 Gestational [pregnancy-induced] hypertension without significant proteinuria, third trimester: Secondary | ICD-10-CM | POA: Diagnosis not present

## 2015-01-09 LAB — OB RESULTS CONSOLE GC/CHLAMYDIA
Chlamydia: NEGATIVE
Gonorrhea: NEGATIVE

## 2015-01-09 LAB — URINALYSIS, ROUTINE W REFLEX MICROSCOPIC
Bilirubin Urine: NEGATIVE
GLUCOSE, UA: NEGATIVE mg/dL
Hgb urine dipstick: NEGATIVE
KETONES UR: 15 mg/dL — AB
NITRITE: NEGATIVE
PH: 7 (ref 5.0–8.0)
PROTEIN: NEGATIVE mg/dL
Specific Gravity, Urine: 1.02 (ref 1.005–1.030)
UROBILINOGEN UA: 0.2 mg/dL (ref 0.0–1.0)

## 2015-01-09 LAB — COMPREHENSIVE METABOLIC PANEL
ALBUMIN: 3.3 g/dL — AB (ref 3.5–5.0)
ALT: 14 U/L (ref 14–54)
ANION GAP: 4 — AB (ref 5–15)
AST: 19 U/L (ref 15–41)
Alkaline Phosphatase: 112 U/L (ref 38–126)
BILIRUBIN TOTAL: 0.2 mg/dL — AB (ref 0.3–1.2)
BUN: 8 mg/dL (ref 6–20)
CO2: 22 mmol/L (ref 22–32)
CREATININE: 0.58 mg/dL (ref 0.44–1.00)
Calcium: 9.3 mg/dL (ref 8.9–10.3)
Chloride: 108 mmol/L (ref 101–111)
GFR calc Af Amer: >60 mL/min (ref >60)
GFR calc non Af Amer: >60 mL/min (ref >60)
Glucose, Bld: 81 mg/dL (ref 65–99)
Potassium: 3.2 mmol/L — ABNORMAL LOW (ref 3.5–5.1)
Sodium: 134 mmol/L — ABNORMAL LOW (ref 135–145)
TOTAL PROTEIN: 6.8 g/dL (ref 6.5–8.1)

## 2015-01-09 LAB — CBC
HCT: 31 % — ABNORMAL LOW (ref 36.0–46.0)
Hemoglobin: 10.8 g/dL — ABNORMAL LOW (ref 12.0–15.0)
MCH: 31.7 pg (ref 26.0–34.0)
MCHC: 34.8 g/dL (ref 30.0–36.0)
MCV: 90.9 fL (ref 78.0–100.0)
PLATELETS: 212 10*3/uL (ref 150–400)
RBC: 3.41 MIL/uL — AB (ref 3.87–5.11)
RDW: 13.4 % (ref 11.5–15.5)
WBC: 11.5 10*3/uL — AB (ref 4.0–10.5)

## 2015-01-09 LAB — PROTEIN / CREATININE RATIO, URINE
Creatinine, Urine: 270 mg/dL
PROTEIN CREATININE RATIO: 0.09 mg/mg{creat} (ref 0.00–0.15)
TOTAL PROTEIN, URINE: 24 mg/dL

## 2015-01-09 LAB — URINE MICROSCOPIC-ADD ON

## 2015-01-09 LAB — URIC ACID: Uric Acid, Serum: 4.5 mg/dL (ref 2.3–6.6)

## 2015-01-09 LAB — OB RESULTS CONSOLE GBS: STREP GROUP B AG: NEGATIVE

## 2015-01-09 LAB — LACTATE DEHYDROGENASE: LDH: 132 U/L (ref 98–192)

## 2015-01-09 MED ORDER — POTASSIUM CHLORIDE ER 10 MEQ PO TBCR: 10.0000 meq | EXTENDED_RELEASE_TABLET | Freq: Every day | ORAL | Status: DC

## 2015-01-09 NOTE — MAU Provider Note (Signed)
History     CSN: [id number]  Arrival date and time: 01/09/15 1630   None     Chief Complaint  Patient presents with  . Hypertension   HPI   [name] is a 24 y.o. female [phone] at [redacted]w[redacted]d here today with elevated BP readings. She was seen in the office today and sent over for further testing.   Patients cervix was checked in the office today and was 1 cm.   + fetal movement Denies vaginal bleeding.   OB History    Gravida Para Term Preterm AB TAB SAB Ectopic Multiple Living   '7 3 2 1 3 2 1   3      '$ Past Medical History  Diagnosis Date  . Strep throat   . Preterm labor   . Anxiety     Past Surgical History  Procedure Laterality Date  . Dilation and curettage of uterus      Family History  Problem Relation Age of Onset  . Other Neg Hx     History  Substance Use Topics  . Smoking status: Current Every Day Smoker -- 0.50 packs/day    Types: Cigarettes  . Smokeless tobacco: Never Used  . Alcohol Use: Yes     Comment: socially,     Allergies: No Known Allergies  Prescriptions prior to admission  Medication Sig Dispense Refill Last Dose  . calcium carbonate (TUMS - DOSED IN MG ELEMENTAL CALCIUM) 500 MG chewable tablet Chew 2 tablets by mouth as needed for indigestion or heartburn.   01/09/2015 at Unknown time  . Prenatal Vit-Fe Fumarate-FA (PRENATAL MULTIVITAMIN) TABS tablet Take 1 tablet by mouth daily at 12 noon.   01/09/2015 at Unknown time  . ranitidine (ZANTAC) 75 MG tablet Take 75 mg by mouth 2 (two) times daily.   Past Week at Unknown time  . ibuprofen (ADVIL,MOTRIN) 200 MG tablet Take 800 mg by mouth every 6 (six) hours as needed for pain.   Not Taking at Unknown time  . norgestimate-ethinyl estradiol (ORTHO-CYCLEN,SPRINTEC,PREVIFEM) 0.25-35 MG-MCG tablet Take 1 tablet by mouth daily. (Patient not taking: Reported on 01/09/2015) 1 Package 1 09/29/2012 at Unknown   Results for orders placed or performed during the hospital encounter of 01/09/15  (from the past 48 hour(s))  Urinalysis, Routine w reflex microscopic (not at [facility])     Status: Abnormal   Collection Time: 01/09/15  4:44 PM  Result Value Ref Range   Color, Urine YELLOW YELLOW   APPearance HAZY (A) CLEAR   Specific Gravity, Urine 1.020 1.005 - 1.030   pH 7.0 5.0 - 8.0   Glucose, UA NEGATIVE NEGATIVE mg/dL   Hgb urine dipstick NEGATIVE NEGATIVE   Bilirubin Urine NEGATIVE NEGATIVE   Ketones, ur 15 (A) NEGATIVE mg/dL   Protein, ur NEGATIVE NEGATIVE mg/dL   Urobilinogen, UA 0.2 0.0 - 1.0 mg/dL   Nitrite NEGATIVE NEGATIVE   Leukocytes, UA SMALL (A) NEGATIVE  Protein / creatinine ratio, urine     Status: None   Collection Time: 01/09/15  4:44 PM  Result Value Ref Range   Creatinine, Urine 270.00 mg/dL   Total Protein, Urine 24 mg/dL    Comment: NO NORMAL RANGE ESTABLISHED FOR THIS TEST   Protein Creatinine Ratio 0.09 0.00 - 0.15 mg/mg[Cre]  Urine microscopic-add on     Status: Abnormal   Collection Time: 01/09/15  4:44 PM  Result Value Ref Range   Squamous Epithelial / LPF MANY (A) RARE   WBC, UA 0-2 <3  WBC/hpf   RBC / HPF 0-2 <3 RBC/hpf   Bacteria, UA RARE RARE   Crystals CA OXALATE CRYSTALS (A) NEGATIVE  CBC     Status: Abnormal   Collection Time: 01/09/15  5:08 PM  Result Value Ref Range   WBC 11.5 (H) 4.0 - 10.5 K/uL   RBC 3.41 (L) 3.87 - 5.11 MIL/uL   Hemoglobin 10.8 (L) 12.0 - 15.0 g/dL   HCT 31.0 (L) 36.0 - 46.0 %   MCV 90.9 78.0 - 100.0 fL   MCH 31.7 26.0 - 34.0 pg   MCHC 34.8 30.0 - 36.0 g/dL   RDW 13.4 11.5 - 15.5 %   Platelets 212 150 - 400 K/uL  Comprehensive metabolic panel     Status: Abnormal   Collection Time: 01/09/15  5:08 PM  Result Value Ref Range   Sodium 134 (L) 135 - 145 mmol/L   Potassium 3.2 (L) 3.5 - 5.1 mmol/L   Chloride 108 101 - 111 mmol/L   CO2 22 22 - 32 mmol/L   Glucose, Bld 81 65 - 99 mg/dL   BUN 8 6 - 20 mg/dL   Creatinine, Ser 0.58 0.44 - 1.00 mg/dL   Calcium 9.3 8.9 - 10.3 mg/dL   Total Protein 6.8 6.5 - 8.1 g/dL    Albumin 3.3 (L) 3.5 - 5.0 g/dL   AST 19 15 - 41 U/L   ALT 14 14 - 54 U/L   Alkaline Phosphatase 112 38 - 126 U/L   Total Bilirubin 0.2 (L) 0.3 - 1.2 mg/dL   GFR calc non Af Amer >60 >60 mL/min   GFR calc Af Amer >60 >60 mL/min    Comment: (NOTE) The eGFR has been calculated using the CKD EPI equation. This calculation has not been validated in all clinical situations. eGFR's persistently <60 mL/min signify possible Chronic Kidney Disease.    Anion gap 4 (L) 5 - 15  Uric acid     Status: None   Collection Time: 01/09/15  5:08 PM  Result Value Ref Range   Uric Acid, Serum 4.5 2.3 - 6.6 mg/dL  Lactate dehydrogenase     Status: None   Collection Time: 01/09/15  5:08 PM  Result Value Ref Range   LDH 132 98 - 192 U/L    Review of Systems  Eyes: Negative for blurred vision.  Cardiovascular: Negative for leg swelling.  Gastrointestinal: Positive for abdominal pain (Bilateral lower abdominal cramping ).  Neurological: Negative for headaches.   Physical Exam   Blood pressure 145/80, pulse 88, temperature 98.8 F (37.1 C), temperature source Oral, resp. rate 18, height $RemoveBe'5\' 3"'YSrDjyRkP$  (1.6 m), weight 87.998 kg (194 lb).  Physical Exam  Constitutional: She is oriented to person, place, and time. She appears well-developed and well-nourished. No distress.  HENT:  Head: Normocephalic.  Neck: Neck supple.  Respiratory: Effort normal.  GI: There is no tenderness.  Musculoskeletal: Normal range of motion. She exhibits no edema.  Neurological: She is alert and oriented to person, place, and time. She has normal reflexes. She displays normal reflexes.  Negative clonus   Skin: She is not diaphoretic.   Fetal Tracing: Baseline: 150 bpm Variability: moderate  Accelerations: 15x15 Decelerations: variable  Toco: 2-4 mins apart,  irregular patter with UI   MAU Course  Procedures  MDM  Discussed labs, BP readings and fetal tracing with Dr. Ruthann Cancer; ok to discharge the patient home.    Assessment and Plan   A:  1. Pregnancy induced hypertension, third trimester  2. Hypokalemia    P:  Discharge home in stable condition Follow up with Dr. Ruthann Cancer as scheduled  Return to MAU if symptoms worsen Preeclampsia precautions  Kick counts    Lezlie Lye, NP 01/09/2015 6:25 PM   Patient needs PO potassium per bottle instructions; message left on her cell phone to call.

## 2015-01-09 NOTE — Progress Notes (Signed)
Patient returned call from Jennifer Rasch, NP. Patient identVenia Carbonified with two identifiers.  Patient had low potassium, and needs rx for K-DUR. [facility] [address] [place]. Patient verbalizes understanding.

## 2015-01-09 NOTE — Discharge Instructions (Signed)

## 2015-01-09 NOTE — MAU Note (Signed)
Sent from MD office for further evaluation of elevated BP.

## 2015-01-21 ENCOUNTER — Encounter (HOSPITAL_COMMUNITY): Payer: Self-pay | Admitting: *Deleted

## 2015-01-21 ENCOUNTER — Inpatient Hospital Stay (HOSPITAL_COMMUNITY)
Admission: AD | Admit: 2015-01-21 | Discharge: 2015-01-21 | Disposition: A | Discharge status: Home / Self Care | Payer: Medicaid Other | Source: Ambulatory Visit | Attending: Obstetrics | Admitting: Obstetrics

## 2015-01-21 DIAGNOSIS — Z3A38 38 weeks gestation of pregnancy: Secondary | ICD-10-CM | POA: Diagnosis not present

## 2015-01-21 DIAGNOSIS — Z3493 Encounter for supervision of normal pregnancy, unspecified, third trimester: Secondary | ICD-10-CM | POA: Diagnosis not present

## 2015-01-21 NOTE — MAU Note (Signed)
Contractions every 3-6 minutes. Denies of bright red vaginal bleeding.  Denies bloody show.  Positive fetal movement Denies SROM/LOF Denies any infections/complications of pregnancy  GBS negative per patient

## 2015-01-27 ENCOUNTER — Encounter (HOSPITAL_COMMUNITY): Payer: Self-pay | Admitting: *Deleted

## 2015-01-27 ENCOUNTER — Inpatient Hospital Stay (HOSPITAL_COMMUNITY)
Admission: AD | Admit: 2015-01-27 | Discharge: 2015-01-29 | DRG: 775 | Disposition: A | Discharge status: Home / Self Care | Payer: Medicaid Other | Source: Ambulatory Visit | Attending: Obstetrics | Admitting: Obstetrics

## 2015-01-27 DIAGNOSIS — O99334 Smoking (tobacco) complicating childbirth: Principal | ICD-10-CM | POA: Diagnosis present

## 2015-01-27 DIAGNOSIS — Z3A38 38 weeks gestation of pregnancy: Secondary | ICD-10-CM | POA: Diagnosis present

## 2015-01-27 DIAGNOSIS — F17210 Nicotine dependence, cigarettes, uncomplicated: Secondary | ICD-10-CM | POA: Diagnosis present

## 2015-01-27 LAB — TYPE AND SCREEN
ABO/RH(D): O POS
ANTIBODY SCREEN: NEGATIVE

## 2015-01-27 LAB — CBC
HEMATOCRIT: 33.7 % — AB (ref 36.0–46.0)
HEMOGLOBIN: 11.7 g/dL — AB (ref 12.0–15.0)
MCH: 31.5 pg (ref 26.0–34.0)
MCHC: 34.7 g/dL (ref 30.0–36.0)
MCV: 90.8 fL (ref 78.0–100.0)
Platelets: 233 10*3/uL (ref 150–400)
RBC: 3.71 MIL/uL — ABNORMAL LOW (ref 3.87–5.11)
RDW: 13.6 % (ref 11.5–15.5)
WBC: 11.6 10*3/uL — AB (ref 4.0–10.5)

## 2015-01-27 LAB — RAPID HIV SCREEN (HIV 1/2 AB+AG)
HIV 1/2 ANTIBODIES: NONREACTIVE
HIV-1 P24 Antigen - HIV24: NONREACTIVE

## 2015-01-27 LAB — ABO/RH: ABO/RH(D): O POS

## 2015-01-27 MED ORDER — LACTATED RINGERS IV SOLN: 500.0000 mL | INTRAVENOUS | Status: DC | PRN

## 2015-01-27 MED ORDER — METHYLERGONOVINE MALEATE 0.2 MG/ML IJ SOLN
0.2000 mg | Freq: Once | INTRAMUSCULAR | Status: AC
  Administered 2015-01-27: 0.2 mg via INTRAMUSCULAR

## 2015-01-27 MED ORDER — OXYCODONE-ACETAMINOPHEN 5-325 MG PO TABS
2.0000 | ORAL_TABLET | ORAL | Status: DC | PRN
  Administered 2015-01-27: 2 via ORAL
  Filled 2015-01-27: qty 2

## 2015-01-27 MED ORDER — OXYTOCIN 40 UNITS IN LACTATED RINGERS INFUSION - SIMPLE MED
62.5000 mL/h | INTRAVENOUS | Status: DC
  Administered 2015-01-27: 62.5 mL/h via INTRAVENOUS
  Filled 2015-01-27: qty 1000

## 2015-01-27 MED ORDER — OXYCODONE-ACETAMINOPHEN 5-325 MG PO TABS: 1.0000 | ORAL_TABLET | ORAL | Status: DC | PRN

## 2015-01-27 MED ORDER — LACTATED RINGERS IV SOLN: INTRAVENOUS | Status: DC

## 2015-01-27 MED ORDER — OXYTOCIN BOLUS FROM INFUSION: 500.0000 mL | INTRAVENOUS | Status: DC

## 2015-01-27 MED ORDER — FLEET ENEMA 7-19 GM/118ML RE ENEM: 1.0000 | ENEMA | RECTAL | Status: DC | PRN

## 2015-01-27 MED ORDER — ZOLPIDEM TARTRATE 5 MG PO TABS: 5.0000 mg | ORAL_TABLET | Freq: Every evening | ORAL | Status: DC | PRN

## 2015-01-27 MED ORDER — ONDANSETRON HCL 4 MG/2ML IJ SOLN: 4.0000 mg | INTRAMUSCULAR | Status: DC | PRN

## 2015-01-27 MED ORDER — LANOLIN HYDROUS EX OINT
TOPICAL_OINTMENT | CUTANEOUS | Status: DC | PRN
Start: 2015-01-27 — End: 2015-01-29

## 2015-01-27 MED ORDER — METHYLERGONOVINE MALEATE 0.2 MG PO TABS
0.2000 mg | ORAL_TABLET | Freq: Three times a day (TID) | ORAL | Status: AC
  Administered 2015-01-27 – 2015-01-28 (×5): 0.2 mg via ORAL
  Filled 2015-01-27 (×5): qty 1

## 2015-01-27 MED ORDER — DIBUCAINE 1 % RE OINT: 1.0000 "application " | TOPICAL_OINTMENT | RECTAL | Status: DC | PRN

## 2015-01-27 MED ORDER — OXYCODONE-ACETAMINOPHEN 5-325 MG PO TABS
2.0000 | ORAL_TABLET | ORAL | Status: DC | PRN
  Administered 2015-01-27 – 2015-01-29 (×9): 2 via ORAL
  Filled 2015-01-27 (×9): qty 2

## 2015-01-27 MED ORDER — BENZOCAINE-MENTHOL 20-0.5 % EX AERO: 1.0000 "application " | INHALATION_SPRAY | CUTANEOUS | Status: DC | PRN

## 2015-01-27 MED ORDER — IBUPROFEN 600 MG PO TABS
600.0000 mg | ORAL_TABLET | Freq: Four times a day (QID) | ORAL | Status: DC
  Administered 2015-01-27 – 2015-01-29 (×9): 600 mg via ORAL
  Filled 2015-01-27 (×9): qty 1

## 2015-01-27 MED ORDER — ONDANSETRON HCL 4 MG PO TABS: 4.0000 mg | ORAL_TABLET | ORAL | Status: DC | PRN

## 2015-01-27 MED ORDER — LIDOCAINE HCL (PF) 1 % IJ SOLN
30.0000 mL | INTRAMUSCULAR | Status: DC | PRN
  Filled 2015-01-27: qty 30

## 2015-01-27 MED ORDER — WITCH HAZEL-GLYCERIN EX PADS: 1.0000 "application " | MEDICATED_PAD | CUTANEOUS | Status: DC | PRN

## 2015-01-27 MED ORDER — FENTANYL 2.5 MCG/ML BUPIVACAINE 1/10 % EPIDURAL INFUSION (WH - ANES): 14.0000 mL/h | INTRAMUSCULAR | Status: DC | PRN

## 2015-01-27 MED ORDER — OXYCODONE-ACETAMINOPHEN 5-325 MG PO TABS
1.0000 | ORAL_TABLET | ORAL | Status: DC | PRN
  Filled 2015-01-27: qty 1

## 2015-01-27 MED ORDER — SIMETHICONE 80 MG PO CHEW: 80.0000 mg | CHEWABLE_TABLET | ORAL | Status: DC | PRN

## 2015-01-27 MED ORDER — PHENYLEPHRINE 40 MCG/ML (10ML) SYRINGE FOR IV PUSH (FOR BLOOD PRESSURE SUPPORT)
80.0000 ug | PREFILLED_SYRINGE | INTRAVENOUS | Status: DC | PRN
  Filled 2015-01-27: qty 2

## 2015-01-27 MED ORDER — ACETAMINOPHEN 325 MG PO TABS: 650.0000 mg | ORAL_TABLET | ORAL | Status: DC | PRN

## 2015-01-27 MED ORDER — TETANUS-DIPHTH-ACELL PERTUSSIS 5-2.5-18.5 LF-MCG/0.5 IM SUSP: 0.5000 mL | Freq: Once | INTRAMUSCULAR | Status: DC

## 2015-01-27 MED ORDER — OXYTOCIN 40 UNITS IN LACTATED RINGERS INFUSION - SIMPLE MED: 62.5000 mL/h | INTRAVENOUS | Status: DC | PRN

## 2015-01-27 MED ORDER — CITRIC ACID-SODIUM CITRATE 334-500 MG/5ML PO SOLN: 30.0000 mL | ORAL | Status: DC | PRN

## 2015-01-27 MED ORDER — ACETAMINOPHEN 325 MG PO TABS
650.0000 mg | ORAL_TABLET | ORAL | Status: DC | PRN
Start: 2015-01-27 — End: 2015-01-27

## 2015-01-27 MED ORDER — EPHEDRINE 5 MG/ML INJ
10.0000 mg | INTRAVENOUS | Status: DC | PRN
Start: 2015-01-27 — End: 2015-01-27
  Filled 2015-01-27: qty 2

## 2015-01-27 MED ORDER — DIPHENHYDRAMINE HCL 50 MG/ML IJ SOLN: 12.5000 mg | INTRAMUSCULAR | Status: DC | PRN

## 2015-01-27 MED ORDER — PRENATAL MULTIVITAMIN CH
1.0000 | ORAL_TABLET | Freq: Every day | ORAL | Status: DC
  Administered 2015-01-27 – 2015-01-29 (×3): 1 via ORAL
  Filled 2015-01-27 (×3): qty 1

## 2015-01-27 MED ORDER — DIPHENHYDRAMINE HCL 25 MG PO CAPS: 25.0000 mg | ORAL_CAPSULE | Freq: Four times a day (QID) | ORAL | Status: DC | PRN

## 2015-01-27 MED ORDER — ONDANSETRON HCL 4 MG/2ML IJ SOLN
4.0000 mg | Freq: Four times a day (QID) | INTRAMUSCULAR | Status: DC | PRN
  Administered 2015-01-27: 4 mg via INTRAVENOUS
  Filled 2015-01-27: qty 2

## 2015-01-27 MED ORDER — METHYLERGONOVINE MALEATE 0.2 MG/ML IJ SOLN: 0.2000 mg | Freq: Three times a day (TID) | INTRAMUSCULAR | Status: AC

## 2015-01-27 MED ORDER — SENNOSIDES-DOCUSATE SODIUM 8.6-50 MG PO TABS
2.0000 | ORAL_TABLET | ORAL | Status: DC
  Administered 2015-01-27 – 2015-01-29 (×2): 2 via ORAL
  Filled 2015-01-27 (×2): qty 2

## 2015-01-27 NOTE — MAU Note (Signed)
Pt to go to room 174

## 2015-01-27 NOTE — H&P (Signed)
[name] is a 24 y.o. female presenting for UC's. Maternal Medical History:  Reason for admission: Contractions.   Contractions: Frequency: regular.   Perceived severity is strong.    Prenatal Complications - Diabetes: none.    OB History    Gravida Para Term Preterm AB TAB SAB Ectopic Multiple Living   7 3 2 1 3 2 1   3      Past Medical History  Diagnosis Date  . Strep throat   . Preterm labor   . Anxiety    Past Surgical History  Procedure Laterality Date  . Dilation and curettage of uterus     Family History: family history is negative for Other. Social History:  reports that she has been smoking Cigarettes.  She has been smoking about 0.50 packs per day. She has never used smokeless tobacco. She reports that she drinks alcohol. She reports that she uses illicit drugs (Marijuana).   Prenatal Transfer Tool  Maternal Diabetes: No Genetic Screening: Normal Maternal Ultrasounds/Referrals: Normal Fetal Ultrasounds or other Referrals:  None Maternal Substance Abuse:  No Significant Maternal Medications:  None Significant Maternal Lab Results:  None Other Comments:  None  Review of Systems  All other systems reviewed and are negative.   Dilation: 10 Effacement (%): 100 Station: +2, +1 Exam by:: HMitchell,rnc-ob Blood pressure 130/64, pulse 122, height 5\' 3"  (1.6 m), weight 194 lb (87.998 kg). Maternal Exam:  Uterine Assessment: Contraction strength is firm.  Contraction frequency is regular.   Abdomen: Patient reports no abdominal tenderness. Fetal presentation: vertex  Cervix: Cervix evaluated by digital exam.     Physical Exam  Constitutional: She is oriented to person, place, and time. She appears well-developed and well-nourished.  Eyes: Conjunctivae are normal. Pupils are equal, round, and reactive to light.  Neck: Normal range of motion. Neck supple.  Cardiovascular: Normal rate and regular rhythm.   Respiratory: Effort normal and breath sounds  normal.  GI: Soft.  Musculoskeletal: Normal range of motion.  Neurological: She is alert and oriented to person, place, and time.  Skin: Skin is warm and dry.  Psychiatric: She has a normal mood and affect. Her behavior is normal. Judgment and thought content normal.    Prenatal labs: ABO, Rh: --/--/O POS (07/17 0740) Antibody: PENDING (07/17 0740) Rubella: Immune (12/23 0000) RPR: Nonreactive (12/23 0000)  HBsAg: Negative (12/23 0000)  HIV: Non-reactive (12/23 0000)  GBS: Negative (06/29 0000)   Assessment/Plan: 38.5 weeks.  Active labor.  Admit.   [name] 01/27/2015, 8:35 AM

## 2015-01-27 NOTE — MAU Note (Signed)
Contractions started around 0300 and got worse around 0630.  Pt presented to MAU SVE 7,100,-1/0.  Denies LOF/VB. No complications with previous pregnancies, h/o preterm delivery x1.  GBS-.  IV started bloodwork sent.  Report given to receiving RN provider called orders given pt to room 174.

## 2015-01-27 NOTE — Progress Notes (Signed)
[name] is a 24 y.o. [phone] at 2730w5d by LMP admitted for active labor  Subjective:   Objective: BP 134/63 mmHg  Pulse 72  Temp(Src) 97.5 F (36.4 C) (Oral)  Resp 20  Ht 5\' 3"  (1.6 m)  Wt 194 lb (87.998 kg)  BMI 34.37 kg/m2   Total I/O In: -  Out: 600 [Blood:600]  FHT:  FHR: 140 bpm, variability: moderate,  accelerations:  Present,  decelerations:  Absent UC:   regular, every 2-3 minutes SVE:   Dilation: 10 Effacement (%): 100 Station: +2, +1 Exam by:: HMitchell,rnc-ob  Labs: Lab Results  Component Value Date   WBC 11.6* 01/27/2015   HGB 11.7* 01/27/2015   HCT 33.7* 01/27/2015   MCV 90.8 01/27/2015   PLT 233 01/27/2015    Assessment / Plan: Spontaneous labor, progressing normally  Labor: Progressing normally Preeclampsia:  n/a Fetal Wellbeing:  Category I Pain Control:  Labor support without medications I/D:  n/a Anticipated MOD:  NSVD  [name] A 01/27/2015, 8:39 AM

## 2015-01-28 LAB — CBC
HEMATOCRIT: 28 % — AB (ref 36.0–46.0)
HEMOGLOBIN: 9.6 g/dL — AB (ref 12.0–15.0)
MCH: 31.6 pg (ref 26.0–34.0)
MCHC: 34.3 g/dL (ref 30.0–36.0)
MCV: 92.1 fL (ref 78.0–100.0)
Platelets: 192 10*3/uL (ref 150–400)
RBC: 3.04 MIL/uL — AB (ref 3.87–5.11)
RDW: 13.8 % (ref 11.5–15.5)
WBC: 14.7 10*3/uL — ABNORMAL HIGH (ref 4.0–10.5)

## 2015-01-28 LAB — RPR: RPR: NONREACTIVE

## 2015-01-28 NOTE — Lactation Note (Signed)
This note was copied from the chart of Monica Thelma BargeSamantha Barthold. Lactation Consultation Note Mom has 2 other children she stated she tried to Bf for 2 weeks each and her milk dried up. Mom was supplementing w/formula as well.  Mom has DEBP in room and has used it 1 time today. Explained supply and demand. Mom needs to post pump her breast for 15-20 min. Every three hours. Mom states baby has been waking up to feed every 4-5 hours.  Reviewed w/mom and encouraged to feed baby 8-12 times/24 hours and with feeding cues. Mom reports + breast changes w/pregnancy. Reviewed Baby & Me book's Breastfeeding Basics and pumping and storing milk. Mom doesn't have a pump, states she is going to get one. Mom encouraged to do skin-to-skin. Educated about newborn behavior. Explained how giving formula and not pumping can decrease milk supply. Also explained how stimulating the breast encouraged milk supply even though she may not see anything yet. WH/LC brochure given w/resources, support groups and LC services. Patient Name: [name] VQQVZ'DToday's Date: 01/28/2015 Reason for consult: Initial assessment   Maternal Data Has patient been taught Hand Expression?: Yes Does the patient have breastfeeding experience prior to this delivery?: Yes  Feeding    LATCH Score/Interventions                      Lactation Tools Discussed/Used Tools: Pump Breast pump type: Double-Electric Breast Pump   Consult Status Consult Status: Follow-up Date: 01/29/15 Follow-up type: In-patient    [name], Diamond NickelLAURA G 01/28/2015, 1:23 AM

## 2015-01-28 NOTE — Progress Notes (Signed)
Patient ID: Monica CrumbleSamantha M Huffine, female   DOB: Aug 29, 1990, 24 y.o.   MRN: [id number] Postpartum day 1 Blood pressure 121/54 respiration 18 afebrile pulse 50 Patient has no complaints Fundus firm Lochia moderate Legs negative doing well

## 2015-01-28 NOTE — Progress Notes (Signed)
UR chart review completed.  

## 2015-01-28 NOTE — Progress Notes (Signed)
CSW acknowledges consult for history of anxiety and THC use.  CSW attempted to meet with [name] to complete assessment; however, [name] had numerous visitors in her room. [name] receptive to CSW offer to return in the morning on 7/19.   CSW to follow up.

## 2015-01-29 NOTE — Discharge Summary (Signed)
Obstetric Discharge Summary Reason for Admission: onset of labor Prenatal Procedures: none Intrapartum Procedures: spontaneous vaginal delivery Postpartum Procedures: none Complications-Operative and Postpartum: none HEMOGLOBIN  Date Value Ref Range Status  01/28/2015 9.6* 12.0 - 15.0 g/dL Final   HCT  Date Value Ref Range Status  01/28/2015 28.0* 36.0 - 46.0 % Final    Physical Exam:  General: alert Lochia: appropriate Uterine Fundus: firm Incision: healing well DVT Evaluation: No evidence of DVT seen on physical exam.  Discharge Diagnoses: Term Pregnancy-delivered  Discharge Information: Date: 01/29/2015 Activity: unrestricted Diet: routine Medications: Percocet Condition: improved Instructions: refer to practice specific booklet Discharge to: home Follow-up Information    Follow up with Kathreen CosierMARSHALL,BERNARD A, MD.   Specialty:  Obstetrics and Gynecology   Contact information:   [address] [place] [id number] [phone]       Newborn Data: Live born female  Birth Weight: 6 lb 14.9 oz (3145 g) APGAR: 9, 9  Home with mother.  MARSHALL,BERNARD A 01/29/2015, 6:17 AM

## 2015-01-29 NOTE — Discharge Instructions (Signed)
Discharge instructions   You can wash your hair  Shower  Eat what you want  Drink what you want  See me in 6 weeks  Your ankles are going to swell more in the next 2 weeks than when pregnant  No sex for 6 weeks   Lalitha Ilyas A, MD 01/29/2015

## 2015-01-29 NOTE — Progress Notes (Signed)
Patient ID: [name], female   DOB: [birth year], 24 y.o.   MRN: [id number] Postpartum day 2 Blood pressure 119/54 respiration 19 pulse 57 temp 97 7 Patient has no complaints Fundus firm Lochia moderate Home today

## 2015-01-29 NOTE — Clinical Social Work Maternal (Signed)
CLINICAL SOCIAL WORK MATERNAL/CHILD NOTE  Patient Details  Name: [name] MRN: [id number] Date of Birth: 11/22/1990  Date:  01/29/2015  Clinical Social Worker Initiating Note:  Twilla Khouri, LCSW Date/ Time Initiated:  01/29/15/0900     Child's Name:  [name]   Legal Guardian:  [name] and [name] (parents)  Need for Interpreter:  None   Date of Referral:  01/27/15     Reason for Referral:  Current Substance Use/Substance Use During Pregnancy , History of anixety  Referral Source:  Central Nursery   Address:  [address] [place], [place] [address]  Phone number:  [phone]   Household Members:  Minor Children, Spouse   Natural Supports (not living in the home):  Extended Family   Professional Supports: None   Employment: Homemaker   Type of Work:   N/A  Education:    N/A  Financial Resources:  Medicaid   Other Resources:  Food Stamps , WIC   Cultural/Religious Considerations Which May Impact Care:  None reported  Strengths:  Ability to meet basic needs , Pediatrician chosen , Home prepared for child    Risk Factors/Current Problems:   1)Substance Use : 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  Infant's UDS is negative and MDS is pending; however, first urine not captured.  2)Mental Health Concerns : [name] presents with history of anxiety since 2014. [name] denied history of perinatal mood and anxiety disorders, and denied acute symptoms of anxiety during the pregnancy.    Cognitive State:  Able to Concentrate , Alert , Goal Oriented , Insightful , Linear Thinking    Mood/Affect:  Bright , Comfortable , Calm , Interested    CSW Assessment:  CSW received request for consult due to [name] presenting with a history of anxiety and substance use during the pregnancy.  [name] presented as easily engaged and receptive to the visit. She provided consent for her cousin to remain in the room during the visit. [name] was noted  to be attending to and interacting with the infant during the visit. She was in a pleasant mood and displayed a full range in affect.    [name] reported feelings of readiness and eagerness to return home. She stated that she lives at home with the FOB, her three children (ages 9, 6, and 4), and her step-child.  [name] reported that she has the support from the FOB and a few other family members. Per [name], she is content with her current level of support and recognizes that she is not alone.  CSW explored and validated the normative range of emotions that accompany the transition to postpartum and adjusting to a new sense of normal. [name] shared that she stays at home with her children, but intends to explore employment and educational opportunities once this infant is older.  Despite feelings of stress associated with being a stay at home mother, she reported that she is excited and happy upon the birth of this infant.    [name] reported diagnosis of anxiety 2 years ago. She stated that she would have panic attacks, but was unable to identify prior triggers for symptoms. [name] reported belief that there was no pattern to her symptoms, and discussed how she has learned to "cope" with symptoms. [name] verbalized early signs that she may have a panic attack which allows her to self-regulate prior to having a full panic attack.  [name] denied feeling concerned or worried about limited predicitability of symptoms with   an infant since she believes she has insight and is able to care for herself postpartum.  [name] denied prior history of perinatal mood and anxiety disorders, but presented as receptive to education. [name] to follow up with her medical provider if she notes onset of symptoms.   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 She stated that would smoke THC 4-5 times per week to assist with nausea and nerves. [name] reported last use days prior to infant's birth. [name] stated that she is familiar with the hospital drug  screen policy as her 25 year old was drug screened at birth for history of THC use. [name] stated that CPS became involved since the [facility], but shared that the case was quickly closed since [facility] use did not negatively impact her ability to be a good mother.  [name] denied questions or concerns related to this infant being drug tested, and verbalized awareness that a CPS report will be made if there is a positive drug screen.   [name] denied additional questions, concerns, or needs at this time. [name] agreed to contact CSW if needs arise during admission.   CSW Plan/Description:   1)Patient/Family Education: Perinatal mood and anxiety disorders 2) CSW to monitor infant's drug screens and will notify CPS if there is a positive drug screen.  3)No Further Intervention Required/No Barriers to Discharge    Kelby FamVenning, Charee Tumblin N, LCSW 01/29/2015, 11:21 AM

## 2015-05-22 ENCOUNTER — Encounter (HOSPITAL_COMMUNITY): Payer: Self-pay | Admitting: Emergency Medicine

## 2015-05-22 ENCOUNTER — Emergency Department (HOSPITAL_COMMUNITY): Payer: Medicaid Other

## 2015-05-22 ENCOUNTER — Emergency Department (HOSPITAL_COMMUNITY)
Admission: EM | Admit: 2015-05-22 | Discharge: 2015-05-23 | Disposition: A | Discharge status: Home / Self Care | Payer: Medicaid Other | Attending: Emergency Medicine | Admitting: Emergency Medicine

## 2015-05-22 DIAGNOSIS — Z8751 Personal history of pre-term labor: Secondary | ICD-10-CM | POA: Insufficient documentation

## 2015-05-22 DIAGNOSIS — Z3202 Encounter for pregnancy test, result negative: Secondary | ICD-10-CM | POA: Diagnosis not present

## 2015-05-22 DIAGNOSIS — Z79899 Other long term (current) drug therapy: Secondary | ICD-10-CM | POA: Insufficient documentation

## 2015-05-22 DIAGNOSIS — G5601 Carpal tunnel syndrome, right upper limb: Secondary | ICD-10-CM | POA: Diagnosis not present

## 2015-05-22 DIAGNOSIS — R609 Edema, unspecified: Secondary | ICD-10-CM

## 2015-05-22 DIAGNOSIS — Q248 Other specified congenital malformations of heart: Secondary | ICD-10-CM | POA: Insufficient documentation

## 2015-05-22 DIAGNOSIS — Z720 Tobacco use: Secondary | ICD-10-CM | POA: Diagnosis not present

## 2015-05-22 DIAGNOSIS — R600 Localized edema: Secondary | ICD-10-CM | POA: Insufficient documentation

## 2015-05-22 DIAGNOSIS — R011 Cardiac murmur, unspecified: Secondary | ICD-10-CM | POA: Insufficient documentation

## 2015-05-22 DIAGNOSIS — M79641 Pain in right hand: Secondary | ICD-10-CM | POA: Diagnosis present

## 2015-05-22 DIAGNOSIS — R062 Wheezing: Secondary | ICD-10-CM | POA: Insufficient documentation

## 2015-05-22 DIAGNOSIS — F419 Anxiety disorder, unspecified: Secondary | ICD-10-CM | POA: Insufficient documentation

## 2015-05-22 DIAGNOSIS — Z8709 Personal history of other diseases of the respiratory system: Secondary | ICD-10-CM | POA: Insufficient documentation

## 2015-05-22 LAB — CBC WITH DIFFERENTIAL/PLATELET
BASOS ABS: 0 10*3/uL (ref 0.0–0.1)
BASOS PCT: 0 %
EOS PCT: 7 %
Eosinophils Absolute: 0.4 10*3/uL (ref 0.0–0.7)
HCT: 35.3 % — ABNORMAL LOW (ref 36.0–46.0)
Hemoglobin: 11.8 g/dL — ABNORMAL LOW (ref 12.0–15.0)
Lymphocytes Relative: 37 %
Lymphs Abs: 2.3 10*3/uL (ref 0.7–4.0)
MCH: 29.6 pg (ref 26.0–34.0)
MCHC: 33.4 g/dL (ref 30.0–36.0)
MCV: 88.7 fL (ref 78.0–100.0)
MONO ABS: 0.6 10*3/uL (ref 0.1–1.0)
Monocytes Relative: 9 %
Neutro Abs: 3 10*3/uL (ref 1.7–7.7)
Neutrophils Relative %: 47 %
Platelets: 256 10*3/uL (ref 150–400)
RBC: 3.98 MIL/uL (ref 3.87–5.11)
RDW: 12.6 % (ref 11.5–15.5)
WBC: 6.3 10*3/uL (ref 4.0–10.5)

## 2015-05-22 LAB — COMPREHENSIVE METABOLIC PANEL
ALK PHOS: 43 U/L (ref 38–126)
ALT: 14 U/L (ref 14–54)
AST: 22 U/L (ref 15–41)
Albumin: 3.2 g/dL — ABNORMAL LOW (ref 3.5–5.0)
Anion gap: 7 (ref 5–15)
BILIRUBIN TOTAL: 0.3 mg/dL (ref 0.3–1.2)
BUN: 8 mg/dL (ref 6–20)
CALCIUM: 9.4 mg/dL (ref 8.9–10.3)
CO2: 24 mmol/L (ref 22–32)
CREATININE: 0.7 mg/dL (ref 0.44–1.00)
Chloride: 106 mmol/L (ref 101–111)
GFR calc Af Amer: >60 mL/min (ref >60)
GFR calc non Af Amer: >60 mL/min (ref >60)
Glucose, Bld: 91 mg/dL (ref 65–99)
Potassium: 3.7 mmol/L (ref 3.5–5.1)
SODIUM: 137 mmol/L (ref 135–145)
TOTAL PROTEIN: 6.5 g/dL (ref 6.5–8.1)

## 2015-05-22 LAB — URINALYSIS, ROUTINE W REFLEX MICROSCOPIC
Bilirubin Urine: NEGATIVE
Glucose, UA: NEGATIVE mg/dL
Hgb urine dipstick: NEGATIVE
Ketones, ur: NEGATIVE mg/dL
Nitrite: NEGATIVE
Protein, ur: NEGATIVE mg/dL
Specific Gravity, Urine: 1.02 (ref 1.005–1.030)
Urobilinogen, UA: 1 mg/dL (ref 0.0–1.0)
pH: 7.5 (ref 5.0–8.0)

## 2015-05-22 LAB — POC URINE PREG, ED: Preg Test, Ur: NEGATIVE

## 2015-05-22 LAB — URINE MICROSCOPIC-ADD ON

## 2015-05-22 LAB — D-DIMER, QUANTITATIVE: D-Dimer, Quant: 0.71 ug{FEU}/mL — ABNORMAL HIGH (ref 0.00–0.48)

## 2015-05-22 MED ORDER — ALBUTEROL SULFATE HFA 108 (90 BASE) MCG/ACT IN AERS
2.0000 | INHALATION_SPRAY | Freq: Once | RESPIRATORY_TRACT | Status: AC
  Administered 2015-05-22: 2 via RESPIRATORY_TRACT
  Filled 2015-05-22: qty 6.7

## 2015-05-22 MED ORDER — IOHEXOL 350 MG/ML SOLN
100.0000 mL | Freq: Once | INTRAVENOUS | Status: AC | PRN
  Administered 2015-05-22: 100 mL via INTRAVENOUS

## 2015-05-22 NOTE — ED Provider Notes (Signed)
CSN: [id number]     Arrival date & time 05/22/15  1708 History   First MD Initiated Contact with Patient 05/22/15 2029     Chief Complaint  Patient presents with  . Hand Pain  . Foot Pain     (Consider location/radiation/quality/duration/timing/severity/associated sxs/prior Treatment) The history is provided by the patient.     Pt presents with intermittent swelling of the bilateral feet for 1.5 weeks.  The left > right.  States it goes up to the mid shin and is pitting, better currently. The swelling in her feet is uncomfortable.  It is improved with elevation.  She does spend a lot of time on her feet as she is a mother of 5.  She also has had some right hand cramping and tingling that is worse first thing in the morning - the tingling is in the 2nd-4th fingertips.  The pain resolves throughout the day.  Notes she does drink ETOH excessively on the weekends and also smokes cigarettes.  Has had very mild pain with deep inspiration of right chest yesterday and today.  Denies fevers, cough, SOB, unilateral leg swelling.  She does take birth control pills.   Denies orthopnea, SOB, DOE.    Past Medical History  Diagnosis Date  . Strep throat   . Preterm labor   . Anxiety    Past Surgical History  Procedure Laterality Date  . Dilation and curettage of uterus     Family History  Problem Relation Age of Onset  . Other Neg Hx    Social History  Substance Use Topics  . Smoking status: Current Every Day Smoker -- 0.50 packs/day    Types: Cigarettes  . Smokeless tobacco: Never Used  . Alcohol Use: Yes     Comment: socially when not pregnant    OB History    Gravida Para Term Preterm AB TAB SAB Ectopic Multiple Living   7 4 3 1 3 2 1   0 1     Review of Systems  All other systems reviewed and are negative.     Allergies  Review of patient's allergies indicates no known allergies.  Home Medications   Prior to Admission medications   Medication Sig Start Date End Date Taking?  Authorizing Provider  citalopram (CELEXA) 20 MG tablet Take 20 mg by mouth daily. 04/30/15  Yes Historical Provider, MD  norgestimate-ethinyl estradiol (ORTHO-CYCLEN,SPRINTEC,PREVIFEM) 0.25-35 MG-MCG tablet Take 1 tablet by mouth daily.   Yes Historical Provider, MD  SUBOXONE 8-2 MG FILM place ONE strip UNDER THE TONGUE EVERY DAY 05/14/15  Yes Historical Provider, MD   BP 154/68 mmHg  Pulse 91  Temp(Src) 97.9 F (36.6 C) (Oral)  SpO2 99%  LMP 05/09/2015 (Approximate) Physical Exam  Constitutional: She appears well-developed and well-nourished. No distress.  HENT:  Head: Normocephalic and atraumatic.  Eyes: Conjunctivae are normal.  Neck: Neck supple.  Cardiovascular: Normal rate, regular rhythm and intact distal pulses.   Murmur heard. Pulmonary/Chest: Effort normal. No respiratory distress. She has wheezes. She has no rales.  Abdominal: Soft. She exhibits no distension. There is no tenderness. There is no rebound and no guarding.  Musculoskeletal: Edema: Mild edema bilateral feet.  Bilateral feet without erythema, warmth, tenderness  Neurological: She is alert.  Tinnel negative, phalen positive.    Skin: She is not diaphoretic.  Psychiatric: She has a normal mood and affect. Her behavior is normal.  Nursing note and vitals reviewed.   ED Course  Procedures (including critical care time) Labs  Review Labs Reviewed  COMPREHENSIVE METABOLIC PANEL - Abnormal; Notable for the following:    Albumin 3.2 (*)    All other components within normal limits  CBC WITH DIFFERENTIAL/PLATELET - Abnormal; Notable for the following:    Hemoglobin 11.8 (*)    HCT 35.3 (*)    All other components within normal limits  URINALYSIS, ROUTINE W REFLEX MICROSCOPIC (NOT AT [facility]) - Abnormal; Notable for the following:    Leukocytes, UA TRACE (*)    All other components within normal limits  D-DIMER, QUANTITATIVE (NOT AT [facility]) - Abnormal; Notable for the following:    D-Dimer, Quant 0.71 (*)    All  other components within normal limits  URINE MICROSCOPIC-ADD ON - Abnormal; Notable for the following:    Squamous Epithelial / LPF FEW (*)    Bacteria, UA FEW (*)    All other components within normal limits  POC URINE PREG, ED    Imaging Review Dg Chest 2 View  05/22/2015  CLINICAL DATA:  Patient complains of shortness breath and wheezing for 2 days. EXAM: CHEST  2 VIEW COMPARISON:  09/29/2012 FINDINGS: The heart size and mediastinal contours are within normal limits. Both lungs are clear. No pleural effusion or pneumothorax. The visualized skeletal structures are unremarkable. IMPRESSION: No active cardiopulmonary disease. Electronically Signed   By: Amie Portland M.D.   On: 05/22/2015 21:01   Ct Angio Chest Pe W/cm &/or Wo Cm  05/23/2015  CLINICAL DATA:  Pleuritic chest pain with lower leg swelling. Symptoms for 1 week. Elevated D-dimer. EXAM: CT ANGIOGRAPHY CHEST WITH CONTRAST TECHNIQUE: Multidetector CT imaging of the chest was performed using the standard protocol during bolus administration of intravenous contrast. Multiplanar CT image reconstructions and MIPs were obtained to evaluate the vascular anatomy. CONTRAST:  OMNIPAQUE IOHEXOL 350 MG/ML SOLN COMPARISON:  Chest radiograph earlier this day. FINDINGS: There are no filling defects within the pulmonary arteries to suggest pulmonary embolus. The heart is normal in size. Thoracic aorta is normal in caliber. No dissection. There is a conventional branching pattern from the aortic arch. There is a well-defined 3.0 x 5.7 x 2.0 cm fluid density structure adjacent to the right heart border in the anterior mediastinum. No internal septations, surrounding inflammation, or soft tissue component. No mass effect. 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 No pleural or pericardial effusion. There is no mediastinal or hilar adenopathy. Minimal soft tissue density in the anterior mediastinum consistent with residual thymus. Small blood  in the posterior left lower lobe. The lungs are otherwise clear. There is no consolidation, pulmonary edema or suspicious pulmonary nodule. Normal appearance of the upper abdomen. There are no acute or suspicious osseous abnormalities. Review of the MIP images confirms the above findings. IMPRESSION: 1. No pulmonary embolus or acute intrathoracic abnormality. 2. Well-defined 3.0 x 5.7 x 2.0 cm fluid density structure adjacent to the right heart border in the anterior mediastinal most consistent with pericardial cyst. This is likely an incidental finding. Right heart border Electronically Signed   By: Rubye Oaks M.D.   On: 05/23/2015 00:19   I have personally reviewed and evaluated these images and lab results as part of my medical decision-making.   EKG Interpretation None       ED ECG REPORT   Date: 05/23/2015  Rate: 60  Rhythm: normal sinus rhythm  QRS Axis: normal  Intervals: normal  ST/T Wave abnormalities: normal  Conduction Disutrbances:none  Narrative Interpretation:   Old EKG Reviewed: none available  I have personally reviewed the EKG tracing and agree with the computerized printout as noted.   Wells' Criteria for Pulmonary Embolism score is zero.     MDM   Final diagnoses:  Pericardial cyst  Peripheral edema  Carpal tunnel syndrome of right wrist    Afebrile nontoxic patient with bilateral lower extremity edema, reported to be pitting and to the mid shin, better with elevation x 1 week.  Also notes some right sided chest tightness occasionally without SOB, DOE, or orthopnea.  Found to have pericardial cyst and small blood in posterior LLL.  Pt and results discussed with Dr Cyndie Chime who recommended discussion with CT surgery.  I spoke with Dr Avie Arenas who recommends discharge home.  His office will call pt tomorrow to schedule follow up appointment, anticipating surgery.  Discussed result, findings, treatment, and follow up  with patient.  Pt given return precautions.   Pt verbalizes understanding and agrees with plan.        Trixie Dredge, PA-C 05/23/15 0115  Trixie Dredge, PA-C 05/23/15 4098  Leta [facility], MD 05/23/15 [phone]

## 2015-05-22 NOTE — ED Notes (Signed)
Pt states that she has had swelling in her lower legs and tingling in her hands x 1 week that goes down during the day but comes back after she wakes up in the morning. Neuro intact. Alert and oriented.

## 2015-05-23 NOTE — Discharge Instructions (Signed)
Read the information below.  You may return to the Emergency Department at any time for worsening condition or any new symptoms that concern you.  If you develop worsening chest pain, shortness of breath, fever, you pass out, or become weak or dizzy, return to the ER for a recheck.     You were found to have a cyst (fluid collection) beside your heart.  The cardiothoracic surgeon's office will call you tomorrow with a close follow up appointment for further discussion and management.     Peripheral Edema You have swelling in your legs (peripheral edema). This swelling is due to excess accumulation of salt and water in your body. Edema may be a sign of heart, kidney or liver disease, or a side effect of a medication. It may also be due to problems in the leg veins. Elevating your legs and using special support stockings may be very helpful, if the cause of the swelling is due to poor venous circulation. Avoid long periods of standing, whatever the cause. Treatment of edema depends on identifying the cause. Chips, pretzels, pickles and other salty foods should be avoided. Restricting salt in your diet is almost always needed. Water pills (diuretics) are often used to remove the excess salt and water from your body via urine. These medicines prevent the kidney from reabsorbing sodium. This increases urine flow. Diuretic treatment may also result in lowering of potassium levels in your body. Potassium supplements may be needed if you have to use diuretics daily. Daily weights can help you keep track of your progress in clearing your edema. You should call your caregiver for follow up care as recommended. SEEK IMMEDIATE MEDICAL CARE IF:   You have increased swelling, pain, redness, or heat in your legs.  You develop shortness of breath, especially when lying down.  You develop chest or abdominal pain, weakness, or fainting.  You have a fever.   This information is not intended to replace advice given  to you by your health care provider. Make sure you discuss any questions you have with your health care provider.   Document Released: 08/06/2004 Document Revised: 09/21/2011 Document Reviewed: 01/09/2015 Elsevier Interactive Patient Education 2016 [employer]    Carpal Tunnel Syndrome Carpal tunnel syndrome is a condition that causes pain in your hand and arm. The carpal tunnel is a narrow area located on the palm side of your wrist. Repeated wrist motion or certain diseases may cause swelling within the tunnel. This swelling pinches the main nerve in the wrist (median nerve). CAUSES  This condition may be caused by:   Repeated wrist motions.  Wrist injuries.  Arthritis.  A cyst or tumor in the carpal tunnel.  Fluid buildup during pregnancy. Sometimes the cause of this condition is not known.  RISK FACTORS This condition is more likely to develop in:   People who have jobs that cause them to repeatedly move their wrists in the same motion, such as butchers and cashiers.  Women.  People with certain conditions, such as:  Diabetes.  Obesity.  An underactive thyroid (hypothyroidism).  Kidney failure. SYMPTOMS  Symptoms of this condition include:   A tingling feeling in your fingers, especially in your thumb, index, and middle fingers.  Tingling or numbness in your hand.  An aching feeling in your entire arm, especially when your wrist and elbow are bent for long periods of time.  Wrist pain that goes up your arm to your shoulder.  Pain that goes down into your palm  or fingers.  A weak feeling in your hands. You may have trouble grabbing and holding items. Your symptoms may feel worse during the night.  DIAGNOSIS  This condition is diagnosed with a medical history and physical exam. You may also have tests, including:   An electromyogram (EMG). This test measures electrical signals sent by your nerves into the muscles.  X-rays. TREATMENT  Treatment for this  condition includes:  Lifestyle changes. It is important to stop doing or modify the activity that caused your condition.  Physical or occupational therapy.  Medicines for pain and inflammation. This may include medicine that is injected into your wrist.  A wrist splint.  Surgery. HOME CARE INSTRUCTIONS  If You Have a Splint:  Wear it as told by your health care provider. Remove it only as told by your health care provider.  Loosen the splint if your fingers become numb and tingle, or if they turn cold and blue.  Keep the splint clean and dry. General Instructions  Take over-the-counter and prescription medicines only as told by your health care provider.  Rest your wrist from any activity that may be causing your pain. If your condition is work related, talk to your employer about changes that can be made, such as getting a wrist pad to use while typing.  If directed, apply ice to the painful area:  Put ice in a plastic bag.  Place a towel between your skin and the bag.  Leave the ice on for 20 minutes, 2-3 times per day.  Keep all follow-up visits as told by your health care provider. This is important.  Do any exercises as told by your health care provider, physical therapist, or occupational therapist. SEEK MEDICAL CARE IF:   You have new symptoms.  Your pain is not controlled with medicines.  Your symptoms get worse.   This information is not intended to replace advice given to you by your health care provider. Make sure you discuss any questions you have with your health care provider.   Document Released: 06/26/2000 Document Revised: 03/20/2015 Document Reviewed: 11/14/2014 Elsevier Interactive Patient Education [employer]

## 2015-05-24 ENCOUNTER — Institutional Professional Consult (permissible substitution) (INDEPENDENT_AMBULATORY_CARE_PROVIDER_SITE_OTHER): Payer: Self-pay | Admitting: Cardiothoracic Surgery

## 2015-05-24 ENCOUNTER — Encounter: Payer: Self-pay | Admitting: Cardiothoracic Surgery

## 2015-05-24 VITALS — BP 144/86 | HR 96 | Resp 20 | Ht 63.0 in | Wt 189.0 lb

## 2015-05-24 DIAGNOSIS — Q248 Other specified congenital malformations of heart: Secondary | ICD-10-CM

## 2015-05-24 DIAGNOSIS — Q249 Congenital malformation of heart, unspecified: Secondary | ICD-10-CM

## 2015-05-24 NOTE — Progress Notes (Signed)
[facility] Referring Provider is Trixie DredgeWest, [place], [place]  Chief Complaint  Patient presents with  . Cyst    surgical eval on pericardial cyst, CTA Chest 05/22/15    Patient examined, CT scan of the chest personally reviewed  HPI: 24 year old Caucasian female smoker with history of treated drug abuse presents with recent diagnosis of a 2 cm pericardial cyst on the right heart border at the level of the right hemidiaphragm. This was an incidental finding on a CTA to rule out pulmonary embolus. The patient had some symptoms of pleuritic pain, she is on oral contraceptive, and she had some bilateral pedal edema with a mildly positive d-dimer. CT was negative for pulmonary embolus.. Her lung fields are clear without pulmonary nodules or adenopathy.the pericardial cyst was noted and she was referred for CT surgical evaluation.  Is difficult to discern if the pericardial cyst is symptomatic. She denies any right anterior chest pain or right shoulder pain.  The patient is otherwise in good health. She has had 5 children. Her last child was born 4 months ago. She does have history of drug abuse and is on a naloxone type daily drug. She smokes one half to one pack per day and drinks alcohol on weekends. She has history of depression on Celexa  Past Medical History  Diagnosis Date  . Strep throat   . Preterm labor   . Anxiety     Past Surgical History  Procedure Laterality Date  . Dilation and curettage of uterus      Family History  Problem Relation Age of Onset  . Other Neg Hx     Social History Social History  Substance Use Topics  . Smoking status: Current Every Day Smoker -- 0.50 packs/day    Types: Cigarettes  . Smokeless tobacco: Never Used  . Alcohol Use: Yes     Comment: socially when not pregnant     Current Outpatient Prescriptions  Medication Sig Dispense Refill  . citalopram (CELEXA) 20 MG tablet Take 20 mg by mouth daily.  2  .  norgestimate-ethinyl estradiol (ORTHO-CYCLEN,SPRINTEC,PREVIFEM) 0.25-35 MG-MCG tablet Take 1 tablet by mouth daily.    . SUBOXONE 8-2 MG FILM place ONE strip UNDER THE TONGUE EVERY DAY  0   No current facility-administered medications for this visit.    No Known Allergies  Review of Systems         Review of Systems :  [ y ] = yes, [  ] = no        General :  Weight gain [   ]    Weight loss  [   ]  Fatigue [  ]  Fever [  ]  Chills  [  ]                                Weakness  [  ]           Cardiac :  Chest pain/ pressure [  ]  Resting SOB [  ] exertional SOB [  ]                        Orthopnea [  ]  Pedal edema  Mahler.Beck[yes  ]  Palpitations [  ] Syncope/presyncope [ ]   Paroxysmal nocturnal dyspnea [  ]        Pulmonary : cough [  ]  wheezing [  ]  Hemoptysis [  ] Sputum [  ] Snoring [  ]                              Pneumothorax [  ]  Sleep apnea [  ]       GI : Vomiting [  ]  Dysphagia [  ]  Melena  [  ]  Abdominal pain [  ] BRBPR [  ]              Heart burn [  ]  Constipation [  ] Diarrhea  [  ] Colonoscopy [  ]       GU : Hematuria [  ]  Dysuria [  ]  Nocturia [  ] UTI's [  ]       Vascular : Claudication [  ]  Rest pain [  ]  DVT [  ] Vein stripping [  ] leg ulcers [  ]                          TIA [  ] Stroke [  ]  Varicose veins [  ]       NEURO :  Headaches  [  ] Seizures [  ] Vision changes [  ] Paresthesias [  ]       Musculoskeletal :  Arthritis [  ] Gout  [  ]  Back pain [  ]  Joint pain [  ]       Skin :  Rash [  ]  Melanoma [  ]        Heme : Bleeding problems [  ]Clotting Disorders [  ] Anemia [  ]Blood Transfusion        Endocrine : Diabetes [  ] Thyroid Disorder  [  ]       Psych : Depression [  yes]  Anxiety [  ]  Psych hospitalizations [  ]      The patient is right-hand dominant, she denies any previous thoracic trauma or pneumothorax. She denies any bleeding disorder.                                        BP 144/86 mmHg  Pulse 96   Resp 20  Ht  (1.6 m)  Wt 189 lb (85.73 kg)  BMI 33.49 kg/m2  SpO2 97%  LMP 05/09/2015 (Approximate) Physical Exam      Physical Exam  General: healthy overweight 24 year old Caucasian female no distress HEENT: Normocephalic pupils equal , dentition adequate Neck: Supple without JVD, adenopathy, or bruit Chest: Clear to auscultation, symmetrical breath sounds, no rhonchi, no tenderness             or deformity Cardiovascular: Regular rate and rhythm, no murmur, no gallop, peripheral pulses             palpable in all extremities Abdomen:  Soft, obese,nontender, no palpable mass or organomegaly Extremities: Warm, well-perfused, no clubbing cyanosis edema or tenderness, no significant pedal edema on today's exam              no venous stasis  changes of the legs Rectal/GU: Deferred Neuro: Grossly non--focal and symmetrical throughout Skin: Clean and dry without rash or ulceration   Diagnostic Tests: CTA chest personally reviewed which demonstrates well the 3 cm Pericardial cyst on the right heart border 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 Will await for scheduling surgery until after her youngest child is older as she will be with postoperative restrictions for 2-3 weeks.the patient was strongly recommended to stop smoking in light of the upcoming surgery. If the patient develops symptoms she'll contact us and  the surgery can be scheduled sooner.  Mikey Bussing, MD [facility] [phone]

## 2015-08-21 ENCOUNTER — Encounter: Payer: Self-pay | Admitting: Cardiothoracic Surgery

## 2015-08-21 ENCOUNTER — Ambulatory Visit (INDEPENDENT_AMBULATORY_CARE_PROVIDER_SITE_OTHER): Payer: Medicaid Other | Admitting: Cardiothoracic Surgery

## 2015-08-21 VITALS — BP 114/58 | HR 65 | Resp 16 | Ht 63.0 in | Wt 178.5 lb

## 2015-08-21 DIAGNOSIS — Q249 Congenital malformation of heart, unspecified: Secondary | ICD-10-CM | POA: Diagnosis not present

## 2015-08-21 DIAGNOSIS — Q248 Other specified congenital malformations of heart: Secondary | ICD-10-CM

## 2015-08-21 NOTE — Progress Notes (Signed)
PCP is [facility] Referring Provider is Trixie Dredge, [place]  Chief Complaint  Patient presents with  . Follow-up    3 month to discuss pericardial cyst surgery    HPI: Patient returns for followup of a small right-sided pericardial cyst. This was diagnosed as an incidental finding during a CT scan to rule out pulmonary emboli in the emergency department last November. The cyst is uncomplicated, simple without impingement on surrounding structures and is small, less than 5 cm. It is asymptomatic. Patient otherwise is in good health-as she had a baby 7 months ago.  Past Medical History  Diagnosis Date  . Strep throat   . Preterm labor   . Anxiety     Past Surgical History  Procedure Laterality Date  . Dilation and curettage of uterus      Family History  Problem Relation Age of Onset  . Other Neg Hx     Social History Social History  Substance Use Topics  . Smoking status: Current Every Day Smoker -- 0.50 packs/day    Types: Cigarettes  . Smokeless tobacco: Never Used  . Alcohol Use: Yes     Comment: socially when not pregnant     Current Outpatient Prescriptions  Medication Sig Dispense Refill  . norgestimate-ethinyl estradiol (ORTHO-CYCLEN,SPRINTEC,PREVIFEM) 0.25-35 MG-MCG tablet Take 1 tablet by mouth daily.    . SUBOXONE 8-2 MG FILM place ONE strip UNDER THE TONGUE EVERY DAY  0   No current facility-administered medications for this visit.    No Known Allergies  Review of Systems  No significant right-sided chest pain No significant shortness of breath Patient has anxiety disorder and is on medications Ankle edema for which she was evaluated last November has resolved.  BP 114/58 mmHg  Pulse 65  Resp 16  Ht  (1.6 m)  Wt 178 lb 8 oz (80.967 kg)  BMI 31.63 kg/m2  SpO2 98% Physical Exam    General- alert and comfortable   Lungs- clear without rales, wheezes   Cor- regular rate and rhythm, no murmur , gallop   Abdomen- soft,  non-tender   Extremities - warm, non-tender, minimal edema   Neuro- oriented, appropriate, no focal weakness   Diagnostic Tests:last CT scan personally reviewed and counseled with patient and her husband   Impression:small a symptomatic pericardial cyst No indication for resection Would not recommend repetitive chest CT scans because of exposure to radiation   Plan:patient return in a year with a chest x-ray. She'll let us know if she develops any point tenderness her pain in the right anterior chest.   Mikey Bussing, MD [facility] [phone]

## 2015-09-14 ENCOUNTER — Emergency Department (HOSPITAL_COMMUNITY)
Admission: EM | Admit: 2015-09-14 | Discharge: 2015-09-14 | Disposition: A | Discharge status: Home / Self Care | Payer: Medicaid Other | Attending: Emergency Medicine | Admitting: Emergency Medicine

## 2015-09-14 ENCOUNTER — Encounter (HOSPITAL_COMMUNITY): Payer: Self-pay

## 2015-09-14 DIAGNOSIS — Z8751 Personal history of pre-term labor: Secondary | ICD-10-CM | POA: Insufficient documentation

## 2015-09-14 DIAGNOSIS — F17210 Nicotine dependence, cigarettes, uncomplicated: Secondary | ICD-10-CM | POA: Diagnosis not present

## 2015-09-14 DIAGNOSIS — R000 Tachycardia, unspecified: Secondary | ICD-10-CM | POA: Diagnosis not present

## 2015-09-14 DIAGNOSIS — J069 Acute upper respiratory infection, unspecified: Secondary | ICD-10-CM | POA: Diagnosis not present

## 2015-09-14 DIAGNOSIS — Z793 Long term (current) use of hormonal contraceptives: Secondary | ICD-10-CM | POA: Diagnosis not present

## 2015-09-14 DIAGNOSIS — Z8659 Personal history of other mental and behavioral disorders: Secondary | ICD-10-CM | POA: Insufficient documentation

## 2015-09-14 DIAGNOSIS — Z79899 Other long term (current) drug therapy: Secondary | ICD-10-CM | POA: Insufficient documentation

## 2015-09-14 DIAGNOSIS — R05 Cough: Secondary | ICD-10-CM | POA: Diagnosis present

## 2015-09-14 MED ORDER — KETOROLAC TROMETHAMINE 60 MG/2ML IM SOLN
60.0000 mg | Freq: Once | INTRAMUSCULAR | Status: AC
  Administered 2015-09-14: 60 mg via INTRAMUSCULAR
  Filled 2015-09-14: qty 2

## 2015-09-14 MED ORDER — BENZONATATE 100 MG PO CAPS: 100.0000 mg | ORAL_CAPSULE | Freq: Three times a day (TID) | ORAL | Status: DC

## 2015-09-14 NOTE — ED Notes (Signed)
Pt arrived to room, c/o headache and general body aches

## 2015-09-14 NOTE — ED Notes (Signed)
She c/o congested cough and aches x 2 days.  She denies fever/n/v/d and is in no distress.

## 2015-09-14 NOTE — Discharge Instructions (Signed)
Please read and follow all provided instructions.  Your diagnoses today include:  1. Upper respiratory infection with cough and congestion    You appear to have an upper respiratory infection (URI). An upper respiratory tract infection, or cold, is a viral infection of the air passages leading to the lungs. It should improve gradually after 5-7 days. You may have a lingering cough that lasts for 2- 4 weeks after the infection.  Tests performed today include:  Vital signs. See below for your results today.   Medications prescribed:   Tessalon Perles - cough suppressant medication  Take any prescribed medications only as directed. Treatment for your infection is aimed at treating the symptoms. There are no medications, such as antibiotics, that will cure your infection.   Home care instructions:  Follow any educational materials contained in this packet.   Your illness is contagious and can be spread to others, especially during the first 3 or 4 days. It cannot be cured by antibiotics or other medicines. Take basic precautions such as washing your hands often, covering your mouth when you cough or sneeze, and avoiding public places where you could spread your illness to others.   Please continue drinking plenty of fluids.  Use over-the-counter medicines as needed as directed on packaging for symptom relief.  You may also use ibuprofen or tylenol as directed on packaging for pain or fever.  Do not take multiple medicines containing Tylenol or acetaminophen to avoid taking too much of this medication.  Follow-up instructions: Please follow-up with your primary care provider in the next 3 days for further evaluation of your symptoms if you are not feeling better.   Return instructions:   Please return to the Emergency Department if you experience worsening symptoms.   RETURN IMMEDIATELY IF you develop shortness of breath, confusion or altered mental status, a new rash, become dizzy, faint,  or poorly responsive, or are unable to be cared for at home.  Please return if you have persistent vomiting and cannot keep down fluids or develop a fever that is not controlled by tylenol or motrin.    Please return if you have any other emergent concerns.  Additional Information:  Your vital signs today were: BP 119/84 mmHg   Pulse 109   Temp(Src) 98.9 F (37.2 C) (Oral)   Resp 16   SpO2 100%   LMP 09/07/2015 (Exact Date) If your blood pressure (BP) was elevated above 135/85 this visit, please have this repeated by your doctor within one month. --------------

## 2015-09-14 NOTE — ED Provider Notes (Signed)
CSN: [id number]     Arrival date & time 09/14/15  [id number] History   First MD Initiated Contact with Patient 09/14/15 1103     Chief Complaint  Patient presents with  . URI     (Consider location/radiation/quality/duration/timing/severity/associated sxs/prior Treatment) HPI Comments: Patient with no significant PMH presents with cough, congestion, body aches, chills and frontal HA x 2 days. Patient states that 3 days ago she was drinking alcohol heavily and thought that she was hung over at first. No fever, N/V/D. No chest pain or abdominal pain. Frontal HA not relieved with 800mg  ibuprofen at home. No weakness, numbness in extremities, confusion, neck pain. No history of PE. The onset of this condition was acute. The course is constant. Aggravating factors: none. Alleviating factors: none.     Patient is a 25 y.o. female presenting with URI. The history is provided by the patient.  URI Presenting symptoms: congestion, cough, fatigue and rhinorrhea   Presenting symptoms: no ear pain, no fever and no sore throat   Associated symptoms: headaches and myalgias   Associated symptoms: no wheezing     Past Medical History  Diagnosis Date  . Strep throat   . Preterm labor   . Anxiety    Past Surgical History  Procedure Laterality Date  . Dilation and curettage of uterus     Family History  Problem Relation Age of Onset  . Other Neg Hx    Social History  Substance Use Topics  . Smoking status: Current Every Day Smoker -- 0.50 packs/day    Types: Cigarettes  . Smokeless tobacco: Never Used  . Alcohol Use: Yes     Comment: socially when not pregnant    OB History    Gravida Para Term Preterm AB TAB SAB Ectopic Multiple Living   7 4 3 1 3 2 1   0 1     Review of Systems  Constitutional: Positive for chills and fatigue. Negative for fever.  HENT: Positive for congestion and rhinorrhea. Negative for ear pain, sinus pressure and sore throat.   Eyes: Negative for redness.  Respiratory:  Positive for cough. Negative for wheezing.   Gastrointestinal: Negative for nausea, vomiting, abdominal pain and diarrhea.  Genitourinary: Negative for dysuria.  Musculoskeletal: Positive for myalgias. Negative for neck stiffness.  Skin: Negative for rash.  Neurological: Positive for headaches.  Hematological: Negative for adenopathy.      Allergies  Review of patient's allergies indicates no known allergies.  Home Medications   Prior to Admission medications   Medication Sig Start Date End Date Taking? Authorizing Provider  benzonatate (TESSALON) 100 MG capsule Take 1 capsule (100 mg total) by mouth every 8 (eight) hours. 09/14/15   Renne CriglerJoshua Jinelle Butchko, PA-C  norgestimate-ethinyl estradiol (ORTHO-CYCLEN,SPRINTEC,PREVIFEM) 0.25-35 MG-MCG tablet Take 1 tablet by mouth daily.    Historical Provider, MD  SUBOXONE 8-2 MG FILM place ONE strip UNDER THE TONGUE EVERY DAY 05/14/15   Historical Provider, MD   BP 119/84 mmHg  Pulse 109  Temp(Src) 98.9 F (37.2 C) (Oral)  Resp 16  SpO2 100%  LMP 09/07/2015 (Exact Date)   Physical Exam  Constitutional: She appears well-developed and well-nourished.  HENT:  Head: Normocephalic and atraumatic.  Right Ear: Tympanic membrane, external ear and ear canal normal.  Left Ear: Tympanic membrane, external ear and ear canal normal.  Nose: Nose normal. No mucosal edema or rhinorrhea.  Mouth/Throat: Uvula is midline, oropharynx is clear and moist and mucous membranes are normal. Mucous membranes are not dry. No  oral lesions. No trismus in the jaw. No uvula swelling. No oropharyngeal exudate, posterior oropharyngeal edema, posterior oropharyngeal erythema or tonsillar abscesses.  Eyes: Conjunctivae are normal. Right eye exhibits no discharge. Left eye exhibits no discharge.  Neck: Normal range of motion. Neck supple.  Cardiovascular: Normal rate, regular rhythm and normal heart sounds.   Mild tachycardia 105  Pulmonary/Chest: Effort normal and breath sounds  normal. No respiratory distress. She has no wheezes. She has no rales.  Abdominal: Soft. There is no tenderness.  Lymphadenopathy:    She has no cervical adenopathy.  Neurological: She is alert.  Skin: Skin is warm and dry.  Psychiatric: She has a normal mood and affect.  Nursing note and vitals reviewed.   ED Course  Procedures (including critical care time) Labs Review Labs Reviewed - No data to display  Imaging Review No results found. I have personally reviewed and evaluated these images and lab results as part of my medical decision-making.   EKG Interpretation None       Vital signs reviewed and are as follows: Filed Vitals:   09/14/15 1021  BP: 119/84  Pulse: 109  Temp: 98.9 F (37.2 C)  Resp: 16   Patient counseled on supportive care for viral URI and s/s to return including worsening symptoms, persistent fever, persistent vomiting, or if they have any other concerns. Urged to see PCP if symptoms persist for more than 3 days. Patient verbalizes understanding and agrees with plan.     MDM   Final diagnoses:  Upper respiratory infection with cough and congestion   Patient with symptoms consistent with a viral syndrome. Vitals are stable, no fever. Mild tachycardia. No concerning symptoms for PE. Pt without CP, SOB and has other symptoms of URI. No signs of dehydration. Lung exam normal, no signs of pneumonia. Supportive therapy indicated with return if symptoms worsen.         Renne Crigler, PA-C 09/14/15 1130  Linwood Dibbles, MD 09/14/15 [phone]

## 2015-11-22 ENCOUNTER — Encounter (HOSPITAL_COMMUNITY): Payer: Self-pay | Admitting: *Deleted

## 2015-11-22 ENCOUNTER — Emergency Department (HOSPITAL_COMMUNITY)
Admission: EM | Admit: 2015-11-22 | Discharge: 2015-11-23 | Disposition: A | Discharge status: Home / Self Care | Payer: Medicaid Other | Attending: Emergency Medicine | Admitting: Emergency Medicine

## 2015-11-22 DIAGNOSIS — F419 Anxiety disorder, unspecified: Secondary | ICD-10-CM

## 2015-11-22 DIAGNOSIS — F17210 Nicotine dependence, cigarettes, uncomplicated: Secondary | ICD-10-CM | POA: Insufficient documentation

## 2015-11-22 DIAGNOSIS — Z046 Encounter for general psychiatric examination, requested by authority: Secondary | ICD-10-CM | POA: Diagnosis present

## 2015-11-22 DIAGNOSIS — Z79899 Other long term (current) drug therapy: Secondary | ICD-10-CM | POA: Insufficient documentation

## 2015-11-22 DIAGNOSIS — F32A Depression, unspecified: Secondary | ICD-10-CM

## 2015-11-22 DIAGNOSIS — F329 Major depressive disorder, single episode, unspecified: Secondary | ICD-10-CM

## 2015-11-22 DIAGNOSIS — F418 Other specified anxiety disorders: Secondary | ICD-10-CM | POA: Diagnosis not present

## 2015-11-22 DIAGNOSIS — F4310 Post-traumatic stress disorder, unspecified: Secondary | ICD-10-CM | POA: Diagnosis not present

## 2015-11-22 HISTORY — DX: Other psychoactive substance abuse, uncomplicated: F19.10

## 2015-11-22 LAB — COMPREHENSIVE METABOLIC PANEL
ALK PHOS: 55 U/L (ref 38–126)
ALT: 16 U/L (ref 14–54)
AST: 20 U/L (ref 15–41)
Albumin: 4.1 g/dL (ref 3.5–5.0)
Anion gap: 10 (ref 5–15)
BUN: 7 mg/dL (ref 6–20)
CALCIUM: 9.2 mg/dL (ref 8.9–10.3)
CHLORIDE: 105 mmol/L (ref 101–111)
CO2: 26 mmol/L (ref 22–32)
CREATININE: 0.58 mg/dL (ref 0.44–1.00)
GFR calc non Af Amer: >60 mL/min (ref >60)
GLUCOSE: 108 mg/dL — AB (ref 65–99)
Potassium: 3.4 mmol/L — ABNORMAL LOW (ref 3.5–5.1)
SODIUM: 141 mmol/L (ref 135–145)
Total Bilirubin: 1 mg/dL (ref 0.3–1.2)
Total Protein: 6.6 g/dL (ref 6.5–8.1)

## 2015-11-22 LAB — CBC
HEMATOCRIT: 35.1 % — AB (ref 36.0–46.0)
HEMOGLOBIN: 12.4 g/dL (ref 12.0–15.0)
MCH: 29.2 pg (ref 26.0–34.0)
MCHC: 35.3 g/dL (ref 30.0–36.0)
MCV: 82.8 fL (ref 78.0–100.0)
Platelets: 273 10*3/uL (ref 150–400)
RBC: 4.24 MIL/uL (ref 3.87–5.11)
RDW: 13.4 % (ref 11.5–15.5)
WBC: 7.9 10*3/uL (ref 4.0–10.5)

## 2015-11-22 LAB — SALICYLATE LEVEL: Salicylate Lvl: <4 mg/dL (ref 2.8–30.0)

## 2015-11-22 LAB — ACETAMINOPHEN LEVEL: Acetaminophen (Tylenol), Serum: <10 ug/mL — ABNORMAL LOW (ref 10–30)

## 2015-11-22 LAB — RAPID URINE DRUG SCREEN, HOSP PERFORMED
AMPHETAMINES: NOT DETECTED
BARBITURATES: NOT DETECTED
Benzodiazepines: NOT DETECTED
Cocaine: NOT DETECTED
OPIATES: NOT DETECTED
TETRAHYDROCANNABINOL: POSITIVE — AB

## 2015-11-22 LAB — POC URINE PREG, ED: Preg Test, Ur: NEGATIVE

## 2015-11-22 LAB — ETHANOL: Alcohol, Ethyl (B): <5 mg/dL (ref <5)

## 2015-11-22 MED ORDER — IBUPROFEN 200 MG PO TABS: 600.0000 mg | ORAL_TABLET | Freq: Three times a day (TID) | ORAL | Status: DC | PRN

## 2015-11-22 MED ORDER — NICOTINE 21 MG/24HR TD PT24: 21.0000 mg | MEDICATED_PATCH | Freq: Every day | TRANSDERMAL | Status: DC

## 2015-11-22 MED ORDER — ACETAMINOPHEN 325 MG PO TABS
650.0000 mg | ORAL_TABLET | ORAL | Status: DC | PRN
Start: 2015-11-22 — End: 2015-11-23

## 2015-11-22 MED ORDER — ALUM & MAG HYDROXIDE-SIMETH 200-200-20 MG/5ML PO SUSP: 30.0000 mL | ORAL | Status: DC | PRN

## 2015-11-22 MED ORDER — LORAZEPAM 1 MG PO TABS: 1.0000 mg | ORAL_TABLET | Freq: Three times a day (TID) | ORAL | Status: DC | PRN

## 2015-11-22 MED ORDER — ONDANSETRON HCL 4 MG PO TABS: 4.0000 mg | ORAL_TABLET | Freq: Three times a day (TID) | ORAL | Status: DC | PRN

## 2015-11-22 NOTE — ED Notes (Signed)
Per IVC papers "Pt is a danger to self and others. With diagnosed bipolar disorder, cries all the time, becomes angry and hostile often, abusing etoh and mixing with her meds."

## 2015-11-22 NOTE — BH Assessment (Signed)
Assessment completed. Consulted Maryjean Mornharles Kober, PA-C who agrees that pt does not meet inpatient criteria.  Informed Leisa Tapia,PA-C of recommendation.
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
## 2015-11-23 NOTE — ED Notes (Signed)
Pt resting at this time. Pt's ride not available at this time. No s/s of distress noted.

## 2015-11-23 NOTE — ED Notes (Signed)
Pt was anxious to leave event though her significant other did not come to pick her up. Denied SI/HI. Bus ticket given to pt.
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
## 2015-11-23 NOTE — Discharge Instructions (Signed)
Community Resource Guide Outpatient Counseling/Substance Abuse Adult °The [employer]’s “211” is a great source of information about community services available.  Access by dialing 2-1-1 from anywhere in [place], or by website -  www.nc211.org.  ° °Other Local Resources (Updated 07/2015) ° °Crisis Hotlines °  °Services  ° °  °Area Served  °[facility] • Crisis Hotline, available 24 hours a day, 7 days a week: 800-939-5911 [place], [place]  ° [facility] • Crisis Hotline, available 24 hours a day, 7 days a week: 866-275-9552 [place], [place]  °[facility] • Suicide Prevention Hotline, available 24 hours a day, 7 days a week: 800-273-8255 [place], [place]  °[facility] ° • Crisis Hotline, available 24 hours a day, 7 days a week: 336-676-6840 [place], [place] °  °[facility] • Crisis Hotline, available 24 hours a day, 7 days a week: 800-256-2452 All °  °[facility] • Crisis Hotline, available 24 hours a day, 7 days a week: 877-626-1772 All  ° °Other Local Resources (Updated 07/2015) ° °Outpatient Counseling/ Substance Abuse Programs  °Services  ° °  °Address and Phone Number  °[facility] ° • Options include Individual counseling, group counseling, intensive outpatient program (several hours a day, several days a week) °• Offers depression assessments °• Provides methadone maintenance program 336-333-6860 °[address], [address] °[place], [place] [address] °  °[facility] ° • Offers partial hospitalization/day treatment and DUI/DWI programs °• Accepts Medicare, private insurance 336-299-4655 °[address], [address] °[place], [place] [address]  °[facility] ° ° • Services include intensive outpatient program (several hours a day, several days a week), outpatient treatment, DUI/DWI services, family education °• Also has some services specifically for Veterans °• Offers transitional housing   336-886-5594 °[address] °[place], [place] [address] °  °  °[facility] • Accepts Medicare, private pay, and private insurance 336-272-0855 °[address] °[place], [place] [address]  °[facility] • Services include individual counseling, substance abuse intensive outpatient program (several hours a day, several days a week), day treatment °• Accepts Medicare, Medicaid, private insurance 336-271-5888 °[address] °[place], [place] [address]  °[facility] ° • Offers substance abuse intensive outpatient program (several hours a day, several days a week), partial hospitalization program 336-832-9800 °[address] °[place], [place] [address] ° °336-349-4454 °[address] °[place], [place] [address] ° °336-386-3795 °[address] °[place], [place] [address] ° °[phone] °[address] °[place], [place] [address]  °[facility] • Individual counseling only °• Accepts private insurance only 336-292-1510 °[address] °[place], [place] [address]  °[facility] • Methadone maintenance program 800-805-6989 °[address] °[place], [place] [address]  °[facility] • Walk-In Clinic providing substance abuse and mental health counseling °• Accepts Medicaid, Medicare, private insurance °• Offers sliding scale for uninsured 336-342-8316 °[address] °[place], [place]   °[facility] • Offers individual counseling, and intensive in-home services 336-347-7415 °[address] °[place], [place] [address]  °[facility] • Offers individual counseling, family counseling, group therapy, domestic violence counseling, consumer credit counseling °• Accepts Medicare, Medicaid, private insurance °• Offers sliding scale for uninsured 336-387-6161 °[address] °,  [address] ° °336-889-6161 °[place], [address] °[place],  [address]  °[facility] • Offers individual, family   and group counseling °• 3 locations - [place], [place], and [place] ° 336-899-8800 ° °[address] °[place], [place] [address] ° °[address] °[place], [place] [address] ° °[address] °[place], [place] [address]  °[facility]  ° • Offers psychiatric assessment, 8-week Intensive Outpatient Program (several hours a day, several times a week, daytime or evenings), early recovery group, family Program, medication management °• Private pay or private insurance only 336 -621-3381, or  °800-659-3381 °[address] °[place], [place] [address]  °[facility] • Offers individual, couples and family counseling °• Accepts Medicaid, private insurance, and sliding scale for uninsured 336-542-2076 °[address] °[place], [place] [address]  °David Fuller, [facility] • Individual counseling °• Private insurance 336-852-4051 °[address] °[place], [place] [address]  °[facility] ° • Offers assessment, substance abuse treatment, and behavioral health treatment 336-878-6098 °[address] °[place], [place] [address]  °[facility] • Individual counseling °• Accepts private insurance 336-272-1972 °[address] °[place], [place] [address]  °[facility] • Individual counseling °• Accepts Medicare, private insurance 336-547-1574 °[address] °[place], [place] [address]  °[facility]  ° • Offers intensive outpatient program (several hours a day, several times a week) °• Private pay, private insurance [phone] °[address] °[place], [place]  °[facility] • Individual counseling °• Medicare, private insurance 336-505-9494 °[address] °[place], [place] [address]  °[facility]  ° • Offers intensive outpatient program (several hours a day, several times a week) and partial hospitalization  program 336-794-3550 °[address] °[place], [place] [address]  °Parrish McKinney, [facility] • Individual counseling 336-282-1251 °[address] °[place], [place] [address]  °[facility] • Offers Christian counseling to individuals, couples, and families °• Accepts Medicare and private insurance; offers sliding scale for uninsured 336-288-1484 °[address] °[place], [place] [address]  °[facility] • Christian counseling 336-542-2060 °[address] °[place], [place] [address]  °[facility] ° • Offers crisis counseling, individual counseling, group therapy, in-home therapy, domestic violence services, day treatment, DWI services, Community Support Team (CST), Assertive Community Treatment Team (ACTT), substance abuse Intensive Outpatient Program (several hours a day, several times a week) °• 2 locations - [place] and [place] 336-229-5905 °[address] °[place], [place] [address] ° °336-694-1777 °[address] °[place], [place] [address]  °[facility]  ° ° • Individual counseling and group therapy °• Accepts private insurance, Medicare, Medicaid 336-379-7146 °[address] °[place], [place]  °[facility] • Offers individual and family counseling °• Offers LGBTQ services °• Accepts private insurance and private pay 336-288-9190 °[address] °[place], [place] [address]  °[facility]  ° • Offers individual counseling, group therapy, and outpatient detox °• Accepts private insurance 336-389-1413 °[address] °[place], [place]  °[facility] • Individual counseling °• Accepts Medicare, private insurance 336-632-3505 °[address] °[place],  [address]  °[facility] • Individual counseling °• Accepts Medicare, private insurance 336-570-0104 °[address] °,  [address]  °[facility] ° • Offers substance abuse  Intensive Outpatient Program (several hours a day, several times a week) 336-323-1385, or °[phone] °[place],   ° °

## 2015-11-23 NOTE — ED Notes (Signed)
Pt waiting on BF to come get her in the lobby. Pt in her room; no s/s of distress noted.

## 2016-04-10 LAB — PROCEDURE REPORT - SCANNED: PAP SMEAR: NEGATIVE

## 2016-07-09 ENCOUNTER — Emergency Department (HOSPITAL_COMMUNITY): Payer: Medicaid Other

## 2016-07-09 ENCOUNTER — Encounter (HOSPITAL_COMMUNITY): Payer: Self-pay | Admitting: Emergency Medicine

## 2016-07-09 ENCOUNTER — Emergency Department (HOSPITAL_COMMUNITY)
Admission: EM | Admit: 2016-07-09 | Discharge: 2016-07-09 | Disposition: A | Discharge status: Home / Self Care | Payer: Medicaid Other | Attending: Emergency Medicine | Admitting: Emergency Medicine

## 2016-07-09 DIAGNOSIS — O4690 Antepartum hemorrhage, unspecified, unspecified trimester: Secondary | ICD-10-CM

## 2016-07-09 DIAGNOSIS — Z3A01 Less than 8 weeks gestation of pregnancy: Secondary | ICD-10-CM | POA: Insufficient documentation

## 2016-07-09 DIAGNOSIS — E876 Hypokalemia: Secondary | ICD-10-CM

## 2016-07-09 DIAGNOSIS — O99331 Smoking (tobacco) complicating pregnancy, first trimester: Secondary | ICD-10-CM | POA: Insufficient documentation

## 2016-07-09 DIAGNOSIS — N939 Abnormal uterine and vaginal bleeding, unspecified: Secondary | ICD-10-CM

## 2016-07-09 DIAGNOSIS — O200 Threatened abortion: Secondary | ICD-10-CM

## 2016-07-09 DIAGNOSIS — F17210 Nicotine dependence, cigarettes, uncomplicated: Secondary | ICD-10-CM | POA: Insufficient documentation

## 2016-07-09 LAB — COMPREHENSIVE METABOLIC PANEL
ALK PHOS: 50 U/L (ref 38–126)
ALT: 14 U/L (ref 14–54)
AST: 15 U/L (ref 15–41)
Albumin: 4.1 g/dL (ref 3.5–5.0)
Anion gap: 5 (ref 5–15)
BUN: 6 mg/dL (ref 6–20)
CALCIUM: 8.7 mg/dL — AB (ref 8.9–10.3)
CO2: 25 mmol/L (ref 22–32)
CREATININE: 0.7 mg/dL (ref 0.44–1.00)
Chloride: 105 mmol/L (ref 101–111)
Glucose, Bld: 90 mg/dL (ref 65–99)
Potassium: 3.3 mmol/L — ABNORMAL LOW (ref 3.5–5.1)
Sodium: 135 mmol/L (ref 135–145)
Total Bilirubin: 0.6 mg/dL (ref 0.3–1.2)
Total Protein: 6.7 g/dL (ref 6.5–8.1)

## 2016-07-09 LAB — URINALYSIS, ROUTINE W REFLEX MICROSCOPIC
BILIRUBIN URINE: NEGATIVE
Glucose, UA: NEGATIVE mg/dL
Ketones, ur: NEGATIVE mg/dL
Nitrite: NEGATIVE
PH: 5 (ref 5.0–8.0)
Protein, ur: NEGATIVE mg/dL
SPECIFIC GRAVITY, URINE: 1.025 (ref 1.005–1.030)

## 2016-07-09 LAB — CBC
HCT: 37.1 % (ref 36.0–46.0)
Hemoglobin: 13.1 g/dL (ref 12.0–15.0)
MCH: 31 pg (ref 26.0–34.0)
MCHC: 35.3 g/dL (ref 30.0–36.0)
MCV: 87.9 fL (ref 78.0–100.0)
PLATELETS: 255 10*3/uL (ref 150–400)
RBC: 4.22 MIL/uL (ref 3.87–5.11)
RDW: 12.7 % (ref 11.5–15.5)
WBC: 8.8 10*3/uL (ref 4.0–10.5)

## 2016-07-09 LAB — WET PREP, GENITAL
CLUE CELLS WET PREP: NONE SEEN
Sperm: NONE SEEN
TRICH WET PREP: NONE SEEN
Yeast Wet Prep HPF POC: NONE SEEN

## 2016-07-09 LAB — I-STAT BETA HCG BLOOD, ED (MC, WL, AP ONLY): I-stat hCG, quantitative: >2000 m[IU]/mL — ABNORMAL HIGH (ref <5)

## 2016-07-09 LAB — HCG, QUANTITATIVE, PREGNANCY: HCG, BETA CHAIN, QUANT, S: 17926 m[IU]/mL — AB (ref <5)

## 2016-07-09 LAB — LIPASE, BLOOD: LIPASE: 19 U/L (ref 11–51)

## 2016-07-09 MED ORDER — PRENATAL COMPLETE 14-0.4 MG PO TABS: 1.0000 | ORAL_TABLET | Freq: Every day | ORAL | 0 refills | Status: AC

## 2016-07-09 MED ORDER — POTASSIUM CHLORIDE CRYS ER 20 MEQ PO TBCR
40.0000 meq | EXTENDED_RELEASE_TABLET | Freq: Once | ORAL | Status: AC
  Administered 2016-07-09: 40 meq via ORAL
  Filled 2016-07-09: qty 2

## 2016-07-09 NOTE — ED Triage Notes (Signed)
Patient reports she had a positive pregnancy test x5 days ago. Patient states she had light vaginal bleeding x2 days and increased vaginal bleeding since yesterday. Patient also reports abdominal cramping with nausea.

## 2016-07-09 NOTE — ED Provider Notes (Signed)
[facility]-EMERGENCY DEPT Provider Note   CSN: [id number] Arrival date & time: 07/09/16  1225     History   Chief Complaint Chief Complaint  Patient presents with  . Abdominal Cramping  . Vaginal Bleeding    HPI Monica CrumbleSamantha M Meadows is a 25 y.o. female.  HPI 25 year old G9P4 at estimated 7-[redacted] wk GA here with vaginal bleeding. Patient states that she was due for her period approximately a week ago. She missed her period and took a home pregnancy test which was positive. Since then, she has had intermittent, aching, crampy-like lower abdominal pain. She endorses associated vaginal spotting and now passage of large vaginal clots. She has a history of miscarriages with similar symptoms. She's had no dysuria or urinary frequency. Denies any fevers. Denies any lightheadedness or syncope. Denies any vaginal discharge. Pain is made worse with movement and palpation. Denies any alleviating factors.  Past Medical History:  Diagnosis Date  . Anxiety   . Polysubstance abuse   . Preterm labor   . Strep throat     Patient Active Problem List   Diagnosis Date Noted  . Indication for care in labor or delivery 01/27/2015  . NSVD (normal spontaneous vaginal delivery) 01/27/2015    Past Surgical History:  Procedure Laterality Date  . DILATION AND CURETTAGE OF UTERUS      OB History    Gravida Para Term Preterm AB Living   7 4 3 1 3 1    SAB TAB Ectopic Multiple Live Births   1 2   0 1       Home Medications    Prior to Admission medications   Medication Sig Start Date End Date Taking? Authorizing Provider  benzonatate (TESSALON) 100 MG capsule Take 1 capsule (100 mg total) by mouth every 8 (eight) hours. Patient not taking: Reported on 07/09/2016 09/14/15   Renne CriglerJoshua Geiple, PA-C  Prenatal Vit-Fe Fumarate-FA (PRENATAL COMPLETE) 14-0.4 MG TABS Take 1 tablet by mouth daily. 07/09/16 08/08/16  Shaune Pollackameron Byrl Latin, MD    Family History Family History  Problem Relation Age of Onset  . Other Neg Hx      Social History Social History  Substance Use Topics  . Smoking status: Current Every Day Smoker    Packs/day: 0.50    Types: Cigarettes  . Smokeless tobacco: Never Used  . Alcohol use Yes     Comment: socially when not pregnant      Allergies   Patient has no known allergies.   Review of Systems Review of Systems  Constitutional: Positive for fatigue. Negative for chills and fever.  HENT: Negative for congestion and rhinorrhea.   Eyes: Negative for visual disturbance.  Respiratory: Negative for cough, shortness of breath and wheezing.   Cardiovascular: Negative for chest pain and leg swelling.  Gastrointestinal: Negative for abdominal pain, diarrhea, nausea and vomiting.  Genitourinary: Positive for pelvic pain, vaginal bleeding and vaginal pain. Negative for dysuria and flank pain.  Musculoskeletal: Negative for neck pain and neck stiffness.  Skin: Negative for rash and wound.  Allergic/Immunologic: Negative for immunocompromised state.  Neurological: Negative for syncope, weakness and headaches.  All other systems reviewed and are negative.    Physical Exam Updated Vital Signs BP 147/66   Pulse 74   Temp 98.4 F (36.9 C)   Resp 18   LMP 05/24/2016   SpO2 100%   Physical Exam  Constitutional: She is oriented to person, place, and time. She appears well-developed and well-nourished. No distress.  HENT:  Head: Normocephalic  and atraumatic.  Eyes: Conjunctivae are normal.  Neck: Neck supple.  Cardiovascular: Normal rate, regular rhythm and normal heart sounds.  Exam reveals no friction rub.   No murmur heard. Pulmonary/Chest: Effort normal and breath sounds normal. No respiratory distress. She has no wheezes. She has no rales.  Abdominal: She exhibits no distension.  Genitourinary:  Genitourinary Comments: Os closed. Moderate volume dark red vaginal blood in vault. No adnexal fullness or TTP. No CMT.  Musculoskeletal: She exhibits no edema.  Neurological:  She is alert and oriented to person, place, and time. She exhibits normal muscle tone.  Skin: Skin is warm. Capillary refill takes less than 2 seconds.  Psychiatric: She has a normal mood and affect.  Nursing note and vitals reviewed.    ED Treatments / Results  Labs (all labs ordered are listed, but only abnormal results are displayed) Labs Reviewed  WET PREP, GENITAL - Abnormal; Notable for the following:       Result Value   WBC, Wet Prep HPF POC MODERATE (*)    All other components within normal limits  COMPREHENSIVE METABOLIC PANEL - Abnormal; Notable for the following:    Potassium 3.3 (*)    Calcium 8.7 (*)    All other components within normal limits  URINALYSIS, ROUTINE W REFLEX MICROSCOPIC - Abnormal; Notable for the following:    Hgb urine dipstick LARGE (*)    Leukocytes, UA TRACE (*)    Bacteria, UA RARE (*)    Squamous Epithelial / LPF 0-5 (*)    All other components within normal limits  HCG, QUANTITATIVE, PREGNANCY - Abnormal; Notable for the following:    hCG, Beta Chain, Quant, S 17,926 (*)    All other components within normal limits  I-STAT BETA HCG BLOOD, ED ([facility], [facility], [facility] ONLY) - Abnormal; Notable for the following:    I-stat hCG, quantitative >2,000.0 (*)    All other components within normal limits  LIPASE, BLOOD  CBC  ABO/RH  GC/CHLAMYDIA PROBE AMP ([place]) NOT AT [facility]    EKG  EKG Interpretation None       Radiology US Ob Comp Less 14 Wks  Result Date: 07/09/2016 CLINICAL DATA:  Abdominal cramping, nausea and bleeding x2 days. Quantitative beta HCG 72,000. EXAM: OBSTETRIC <14 WK [place] AND TRANSVAGINAL OB US TECHNIQUE: Both transabdominal and transvaginal ultrasound examinations were performed for complete evaluation of the gestation as well as the maternal uterus, adnexal regions, and pelvic cul-de-sac. Transvaginal technique was performed to assess early pregnancy. COMPARISON:  None. FINDINGS: Intrauterine gestational sac: Single Yolk sac:   Visualized. Embryo:  Visualized. Cardiac Activity: Visualized. Heart Rate: 113  bpm CRL:  3.3  mm   6 w   0 d                  [place] EDC: 03/04/2017 Subchorionic hemorrhage:  Small Maternal uterus/adnexae: Corpus luteum on the right. Normal left ovary. Trace pelvic free fluid in the cul-de-sac. IMPRESSION: Single live intrauterine gestation at 6 weeks 0 days with small periimplantation bleed. Electronically Signed   By: Tollie Eth M.D.   On: 07/09/2016 19:26   US Ob Transvaginal  Result Date: 07/09/2016 CLINICAL DATA:  Abdominal cramping, nausea and bleeding x2 days. Quantitative beta HCG 72,000. EXAM: OBSTETRIC <14 WK [place] AND TRANSVAGINAL OB US TECHNIQUE: Both transabdominal and transvaginal ultrasound examinations were performed for complete evaluation of the gestation as well as the maternal uterus, adnexal regions, and pelvic cul-de-sac. Transvaginal technique was performed to assess  early pregnancy. COMPARISON:  None. FINDINGS: Intrauterine gestational sac: Single Yolk sac:  Visualized. Embryo:  Visualized. Cardiac Activity: Visualized. Heart Rate: 113  bpm CRL:  3.3  mm   6 w   0 d                  US EDC: 03/04/2017 Subchorionic hemorrhage:  Small Maternal uterus/adnexae: Corpus luteum on the right. Normal left ovary. Trace pelvic free fluid in the cul-de-sac. IMPRESSION: Single live intrauterine gestation at 6 weeks 0 days with small periimplantation bleed. Electronically Signed   By: Tollie Ethavid  Kwon M.D.   On: 07/09/2016 19:26    Procedures Procedures (including critical care time)  Medications Ordered in ED Medications  potassium chloride SA (K-DUR,KLOR-CON) CR tablet 40 mEq (40 mEq Oral Given 07/09/16 2023)     Initial Impression / Assessment and Plan / ED Course  I have reviewed the triage vital signs and the nursing notes.  Pertinent labs & imaging results that were available during my care of the patient were reviewed by me and considered in my medical decision making (see chart for  details).  Clinical Course     25 yo G9P4 at estimated 6-[redacted] wk GA by LMP Here with vaginal bleeding and lower abdominal cramping. VSS. On exam, os closed with moderate amount of dark red blood in vaginal vault. Hgb stable. Given +UPT with abdominal pain, U/S performed and shows IUP at 6 wk0d, with likely implantational bleeding. No signs of ectopic. Pt is O+ and Rhogam not indicated. Discussed labs, findings with pt. Will refer for outpt OB f/u. PNV prescribed.  Final Clinical Impressions(s) / ED Diagnoses   Final diagnoses:  Vaginal bleeding in pregnancy  Threatened miscarriage  Hypokalemia    New Prescriptions Discharge Medication List as of 07/09/2016  8:22 PM    START taking these medications   Details  Prenatal Vit-Fe Fumarate-FA (PRENATAL COMPLETE) 14-0.4 MG TABS Take 1 tablet by mouth daily., Starting Thu 07/09/2016, Until Sat 08/08/2016, Print         Shaune Pollackameron Kalei Mckillop, MD 07/10/16 1214

## 2016-07-09 NOTE — ED Notes (Signed)
Pt obtained a urine sample in triage.

## 2016-07-09 NOTE — ED Notes (Signed)
Patient transported to Ultrasound 

## 2016-07-10 LAB — ABO/RH: ABO/RH(D): O POS

## 2016-07-10 LAB — GC/CHLAMYDIA PROBE AMP (~~LOC~~) NOT AT ARMC
Chlamydia: NEGATIVE
Neisseria Gonorrhea: NEGATIVE

## 2016-08-25 ENCOUNTER — Other Ambulatory Visit: Payer: Self-pay | Admitting: Cardiothoracic Surgery

## 2016-08-25 DIAGNOSIS — Q248 Other specified congenital malformations of heart: Secondary | ICD-10-CM

## 2016-08-26 ENCOUNTER — Ambulatory Visit: Payer: Self-pay | Admitting: Cardiothoracic Surgery

## 2016-09-24 ENCOUNTER — Encounter (HOSPITAL_COMMUNITY): Payer: Self-pay

## 2016-09-24 ENCOUNTER — Emergency Department (HOSPITAL_COMMUNITY)
Admission: EM | Admit: 2016-09-24 | Discharge: 2016-09-24 | Disposition: A | Discharge status: Home / Self Care | Payer: Medicaid Other | Attending: Emergency Medicine | Admitting: Emergency Medicine

## 2016-09-24 DIAGNOSIS — Z79899 Other long term (current) drug therapy: Secondary | ICD-10-CM | POA: Insufficient documentation

## 2016-09-24 DIAGNOSIS — F17210 Nicotine dependence, cigarettes, uncomplicated: Secondary | ICD-10-CM | POA: Insufficient documentation

## 2016-09-24 DIAGNOSIS — N12 Tubulo-interstitial nephritis, not specified as acute or chronic: Secondary | ICD-10-CM

## 2016-09-24 LAB — CBC WITH DIFFERENTIAL/PLATELET
Basophils Absolute: 0 10*3/uL (ref 0.0–0.1)
Basophils Relative: 0 %
EOS ABS: 0.1 10*3/uL (ref 0.0–0.7)
EOS PCT: 1 %
HCT: 42.4 % (ref 36.0–46.0)
HEMOGLOBIN: 14.4 g/dL (ref 12.0–15.0)
LYMPHS ABS: 1.1 10*3/uL (ref 0.7–4.0)
LYMPHS PCT: 8 %
MCH: 30.2 pg (ref 26.0–34.0)
MCHC: 34 g/dL (ref 30.0–36.0)
MCV: 88.9 fL (ref 78.0–100.0)
MONOS PCT: 9 %
Monocytes Absolute: 1.3 10*3/uL — ABNORMAL HIGH (ref 0.1–1.0)
NEUTROS PCT: 82 %
Neutro Abs: 11.2 10*3/uL — ABNORMAL HIGH (ref 1.7–7.7)
Platelets: 243 10*3/uL (ref 150–400)
RBC: 4.77 MIL/uL (ref 3.87–5.11)
RDW: 12.9 % (ref 11.5–15.5)
WBC: 13.7 10*3/uL — AB (ref 4.0–10.5)

## 2016-09-24 LAB — URINALYSIS, ROUTINE W REFLEX MICROSCOPIC
Bilirubin Urine: NEGATIVE
GLUCOSE, UA: NEGATIVE mg/dL
KETONES UR: 5 mg/dL — AB
Nitrite: NEGATIVE
PH: 6 (ref 5.0–8.0)
Protein, ur: 100 mg/dL — AB
SPECIFIC GRAVITY, URINE: 1.011 (ref 1.005–1.030)

## 2016-09-24 LAB — BASIC METABOLIC PANEL
Anion gap: 10 (ref 5–15)
BUN: 8 mg/dL (ref 6–20)
CHLORIDE: 96 mmol/L — AB (ref 101–111)
CO2: 27 mmol/L (ref 22–32)
CREATININE: 0.88 mg/dL (ref 0.44–1.00)
Calcium: 9.3 mg/dL (ref 8.9–10.3)
GFR calc Af Amer: >60 mL/min (ref >60)
GFR calc non Af Amer: >60 mL/min (ref >60)
Glucose, Bld: 102 mg/dL — ABNORMAL HIGH (ref 65–99)
POTASSIUM: 3.5 mmol/L (ref 3.5–5.1)
SODIUM: 133 mmol/L — AB (ref 135–145)

## 2016-09-24 LAB — PREGNANCY, URINE: Preg Test, Ur: NEGATIVE

## 2016-09-24 LAB — I-STAT CG4 LACTIC ACID, ED: Lactic Acid, Venous: 1.12 mmol/L (ref 0.5–1.9)

## 2016-09-24 MED ORDER — ACETAMINOPHEN 325 MG PO TABS
650.0000 mg | ORAL_TABLET | Freq: Once | ORAL | Status: AC | PRN
  Administered 2016-09-24: 650 mg via ORAL
  Filled 2016-09-24: qty 2

## 2016-09-24 MED ORDER — CEPHALEXIN 500 MG PO CAPS: 500.0000 mg | ORAL_CAPSULE | Freq: Three times a day (TID) | ORAL | 0 refills | Status: AC

## 2016-09-24 MED ORDER — SODIUM CHLORIDE 0.9 % IV BOLUS (SEPSIS)
1000.0000 mL | Freq: Once | INTRAVENOUS | Status: AC
  Administered 2016-09-24: 1000 mL via INTRAVENOUS

## 2016-09-24 MED ORDER — KETOROLAC TROMETHAMINE 30 MG/ML IJ SOLN
30.0000 mg | Freq: Once | INTRAMUSCULAR | Status: AC
  Administered 2016-09-24: 30 mg via INTRAVENOUS
  Filled 2016-09-24: qty 1

## 2016-09-24 MED ORDER — DEXTROSE 5 % IV SOLN
1.0000 g | Freq: Once | INTRAVENOUS | Status: AC
  Administered 2016-09-24: 1 g via INTRAVENOUS
  Filled 2016-09-24: qty 10

## 2016-09-24 NOTE — ED Triage Notes (Signed)
Patient c/o low back pain, dysuria,x 2 days. Patient states she has been taking Tylenol and Ibuprofen for pain with no relief. Patient denies any N/V/D or vaginal discharge.

## 2016-09-24 NOTE — ED Provider Notes (Signed)
[facility]-EMERGENCY DEPT Provider Note   CSN: [id number] Arrival date & time: 09/24/16  1413     History   Chief Complaint Chief Complaint  Patient presents with  . Back Pain  . Fever  . Dysuria  . Chills    HPI [name] is a 26 y.o. female.  HPI   26 year old female presents with bilateral back pain and trouble urinating for 2 days. She states that it started off as left-sided back pain and now is bilateral. She has chronic back pain but this is worse than typical. She's been taking ibuprofen and Tylenol without relief. She has also been having pressure when urinating and urinary frequency and emergency. She states there is no dysuria but she's been having "hot urine". 5 or 6 weeks ago the patient had an abortion for a first trimester pregnancy. She has not had any vaginal bleeding or discharge. She denies any fevers but was found to be febrile in triage to 100.6. No nausea or vomiting. No abdominal pain.   Past Medical History:  Diagnosis Date  . Anxiety   . Polysubstance abuse   . Preterm labor   . Strep throat     Patient Active Problem List   Diagnosis Date Noted  . Indication for care in labor or delivery 01/27/2015  . NSVD (normal spontaneous vaginal delivery) 01/27/2015    Past Surgical History:  Procedure Laterality Date  . DILATION AND CURETTAGE OF UTERUS      OB History    Gravida Para Term Preterm AB Living   7 4 3 1 3 1    SAB TAB Ectopic Multiple Live Births   1 2   0 1       Home Medications    Prior to Admission medications   Medication Sig Start Date End Date Taking? Authorizing Provider  acetaminophen (TYLENOL) 500 MG tablet Take 1,000 mg by mouth every 6 (six) hours as needed for moderate pain.   Yes Historical Provider, MD  ibuprofen (ADVIL,MOTRIN) 200 MG tablet Take 800 mg by mouth every 6 (six) hours as needed for moderate pain.   Yes Historical Provider, MD  benzonatate (TESSALON) 100 MG capsule Take 1 capsule (100 mg total) by mouth  every 8 (eight) hours. Patient not taking: Reported on 07/09/2016 09/14/15   Renne CriglerJoshua Geiple, PA-C  cephALEXin (KEFLEX) 500 MG capsule Take 1 capsule (500 mg total) by mouth 3 (three) times daily. 09/24/16 10/08/16  Pricilla LovelessScott Alik Mawson, MD    Family History Family History  Problem Relation Age of Onset  . Other Neg Hx     Social History Social History  Substance Use Topics  . Smoking status: Current Every Day Smoker    Packs/day: 0.00    Types: Cigarettes  . Smokeless tobacco: Never Used  . Alcohol use Yes     Comment: occasionally     Allergies   Patient has no known allergies.   Review of Systems Review of Systems  Constitutional: Negative for fever.  Respiratory: Negative for cough and shortness of breath.   Gastrointestinal: Positive for abdominal pain. Negative for vomiting.  Genitourinary: Positive for dysuria, frequency and urgency. Negative for hematuria, vaginal bleeding and vaginal discharge.  Musculoskeletal: Positive for back pain.  All other systems reviewed and are negative.    Physical Exam Updated Vital Signs BP 136/80 (BP Location: Right Arm)   Pulse 91   Temp (!) 102.3 F (39.1 C) (Oral)   Resp 18   Ht 5' 4.5" (1.638 m)  Wt 150 lb (68 kg)   LMP 08/27/2016   SpO2 97%   BMI 25.35 kg/m   Physical Exam  Constitutional: She is oriented to person, place, and time. She appears well-developed and well-nourished. No distress.  HENT:  Head: Normocephalic and atraumatic.  Right Ear: External ear normal.  Left Ear: External ear normal.  Nose: Nose normal.  Eyes: Right eye exhibits no discharge. Left eye exhibits no discharge.  Cardiovascular: Regular rhythm and normal heart sounds.  Tachycardia present.   Pulmonary/Chest: Effort normal and breath sounds normal.  Abdominal: Soft. There is no tenderness. There is CVA tenderness (bilateral, mild).  Neurological: She is alert and oriented to person, place, and time.  Skin: Skin is warm and dry. She is not  diaphoretic.  Nursing note and vitals reviewed.    ED Treatments / Results  Labs (all labs ordered are listed, but only abnormal results are displayed) Labs Reviewed  URINALYSIS, ROUTINE W REFLEX MICROSCOPIC - Abnormal; Notable for the following:       Result Value   APPearance CLOUDY (*)    Hgb urine dipstick MODERATE (*)    Ketones, ur 5 (*)    Protein, ur 100 (*)    Leukocytes, UA LARGE (*)    Bacteria, UA RARE (*)    Squamous Epithelial / LPF TOO NUMEROUS TO COUNT (*)    All other components within normal limits  BASIC METABOLIC PANEL - Abnormal; Notable for the following:    Sodium 133 (*)    Chloride 96 (*)    Glucose, Bld 102 (*)    All other components within normal limits  CBC WITH DIFFERENTIAL/PLATELET - Abnormal; Notable for the following:    WBC 13.7 (*)    Neutro Abs 11.2 (*)    Monocytes Absolute 1.3 (*)    All other components within normal limits  URINE CULTURE  PREGNANCY, URINE  I-STAT CG4 LACTIC ACID, ED    EKG  EKG Interpretation None       Radiology No results found.  Procedures Procedures (including critical care time)  Medications Ordered in ED Medications  acetaminophen (TYLENOL) tablet 650 mg (650 mg Oral Given 09/24/16 1551)  sodium chloride 0.9 % bolus 1,000 mL (1,000 mLs Intravenous New Bag/Given 09/24/16 1551)  sodium chloride 0.9 % bolus 1,000 mL (1,000 mLs Intravenous New Bag/Given 09/24/16 1551)  cefTRIAXone (ROCEPHIN) 1 g in dextrose 5 % 50 mL IVPB (1 g Intravenous New Bag/Given 09/24/16 1601)  ketorolac (TORADOL) 30 MG/ML injection 30 mg (30 mg Intravenous Given 09/24/16 1624)     Initial Impression / Assessment and Plan / ED Course  I have reviewed the triage vital signs and the nursing notes.  Pertinent labs & imaging results that were available during my care of the patient were reviewed by me and considered in my medical decision making (see chart for details).  Clinical Course as of Sep 25 1646  Thu Sep 24, 2016  1512  Patient most likely has pyelonephritis. She is tachycardic but otherwise appears quite well. We'll give fluids, Tylenol for fever, and evaluated with labs and urinalysis. She has no abdominal pain or vaginal bleeding to suggest that this fevers coming from a septic abortion or retained products.  [SG]    Clinical Course User Index [SG] Pricilla Loveless, MD    Patient appears to have pyelonephritis. She is otherwise healthy. No signs of severe sepsis or septic shock. Her heart rate has significantly improved with Tylenol and fluids. She is not vomiting.  Thinks she is a good candidate to use outpatient management. No allergies. Abdominal exam is benign. I highly doubt kidney stone, especially with bilateral symptoms. No signs of endorgan damage. All she did have an abortion 5 or 6 weeks ago, there is no abdominal tenderness, swelling, or pelvic symptoms to suggest that this is a complication of her surgery. Discussed strict return precautions. Given a dose of IV Rocephin prior to discharge.  Final Clinical Impressions(s) / ED Diagnoses   Final diagnoses:  Pyelonephritis    New Prescriptions New Prescriptions   CEPHALEXIN (KEFLEX) 500 MG CAPSULE    Take 1 capsule (500 mg total) by mouth 3 (three) times daily.     Pricilla Loveless, MD 09/24/16 [phone]

## 2016-09-26 LAB — URINE CULTURE: Culture: 60000 — AB

## 2016-09-27 ENCOUNTER — Telehealth: Payer: Self-pay | Admitting: Emergency Medicine

## 2016-09-27 NOTE — Telephone Encounter (Signed)
Post ED Visit - Positive Culture Follow-up  Culture report reviewed by antimicrobial stewardship pharmacist:  []  Enzo BiNathan Batchelder, Pharm.D. []  Celedonio MiyamotoJeremy Frens, Pharm.D., BCPS []  Garvin FilaMike Maccia, Pharm.D. []  Georgina PillionElizabeth Martin, Pharm.D., BCPS []  [place]h Pham, [place]arm.D., BCPS, AAHIVP []  Estella HuskMichelle Turner, Pharm.D., BCPS, AAHIVP []  Tennis Mustassie Stewart, Pharm.D. []  Rob Oswaldo DoneVincent, [address] BoulevardPharm[address] Lysle Pearlachel Rumbarger PharmD  Positive urine culture Treated with cephalexin, organism sensitive to the same and no further patient follow-up is required at this time.  Berle MullMiller, Galina Haddox 09/27/2016, 12:48 PM

## 2016-11-03 ENCOUNTER — Emergency Department (HOSPITAL_COMMUNITY)
Admission: EM | Admit: 2016-11-03 | Discharge: 2016-11-03 | Disposition: A | Discharge status: Home / Self Care | Payer: Medicaid Other | Attending: Physician Assistant | Admitting: Physician Assistant

## 2016-11-03 ENCOUNTER — Emergency Department (HOSPITAL_COMMUNITY): Payer: Medicaid Other

## 2016-11-03 ENCOUNTER — Encounter (HOSPITAL_COMMUNITY): Payer: Self-pay | Admitting: Emergency Medicine

## 2016-11-03 DIAGNOSIS — M79671 Pain in right foot: Secondary | ICD-10-CM

## 2016-11-03 DIAGNOSIS — L03115 Cellulitis of right lower limb: Secondary | ICD-10-CM | POA: Insufficient documentation

## 2016-11-03 DIAGNOSIS — F17210 Nicotine dependence, cigarettes, uncomplicated: Secondary | ICD-10-CM | POA: Insufficient documentation

## 2016-11-03 DIAGNOSIS — L03031 Cellulitis of right toe: Secondary | ICD-10-CM

## 2016-11-03 MED ORDER — CEPHALEXIN 500 MG PO CAPS: 500.0000 mg | ORAL_CAPSULE | Freq: Four times a day (QID) | ORAL | 0 refills | Status: AC
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
## 2016-11-03 NOTE — Discharge Instructions (Signed)
Please use cold compresses to the area 3-5 times a day for 15-20 minutes. Make sure site is consistently clean and dry. Please follow up with podiatry in 1-2 weeks as needed. Please also follow up with your primary care provider or a provider listed on the financial services guide attached.  Get help right away if: Your symptoms get worse. You feel very sleepy. You develop vomiting or diarrhea that persists. You notice red streaks coming from the infected area. Your red area gets larger or turns dark in color. Contact a health care provider if: Your pain does not get better after a few days of self-care. Your pain gets worse. You cannot stand on your foot. Get help right away if: Your foot is numb or tingling. Your foot or toes are swollen. Your foot or toes turn white or blue. You have warmth and redness along your foot.
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
## 2016-12-14 IMAGING — CR DG CHEST 2V
2 series · 2 of 2 positions shown · non-contrast
Comparison: 09/29/2012

CLINICAL DATA: Patient complains of shortness breath and wheezing
for 2 days.

EXAM:
CHEST  2 VIEW

[w chest pa]
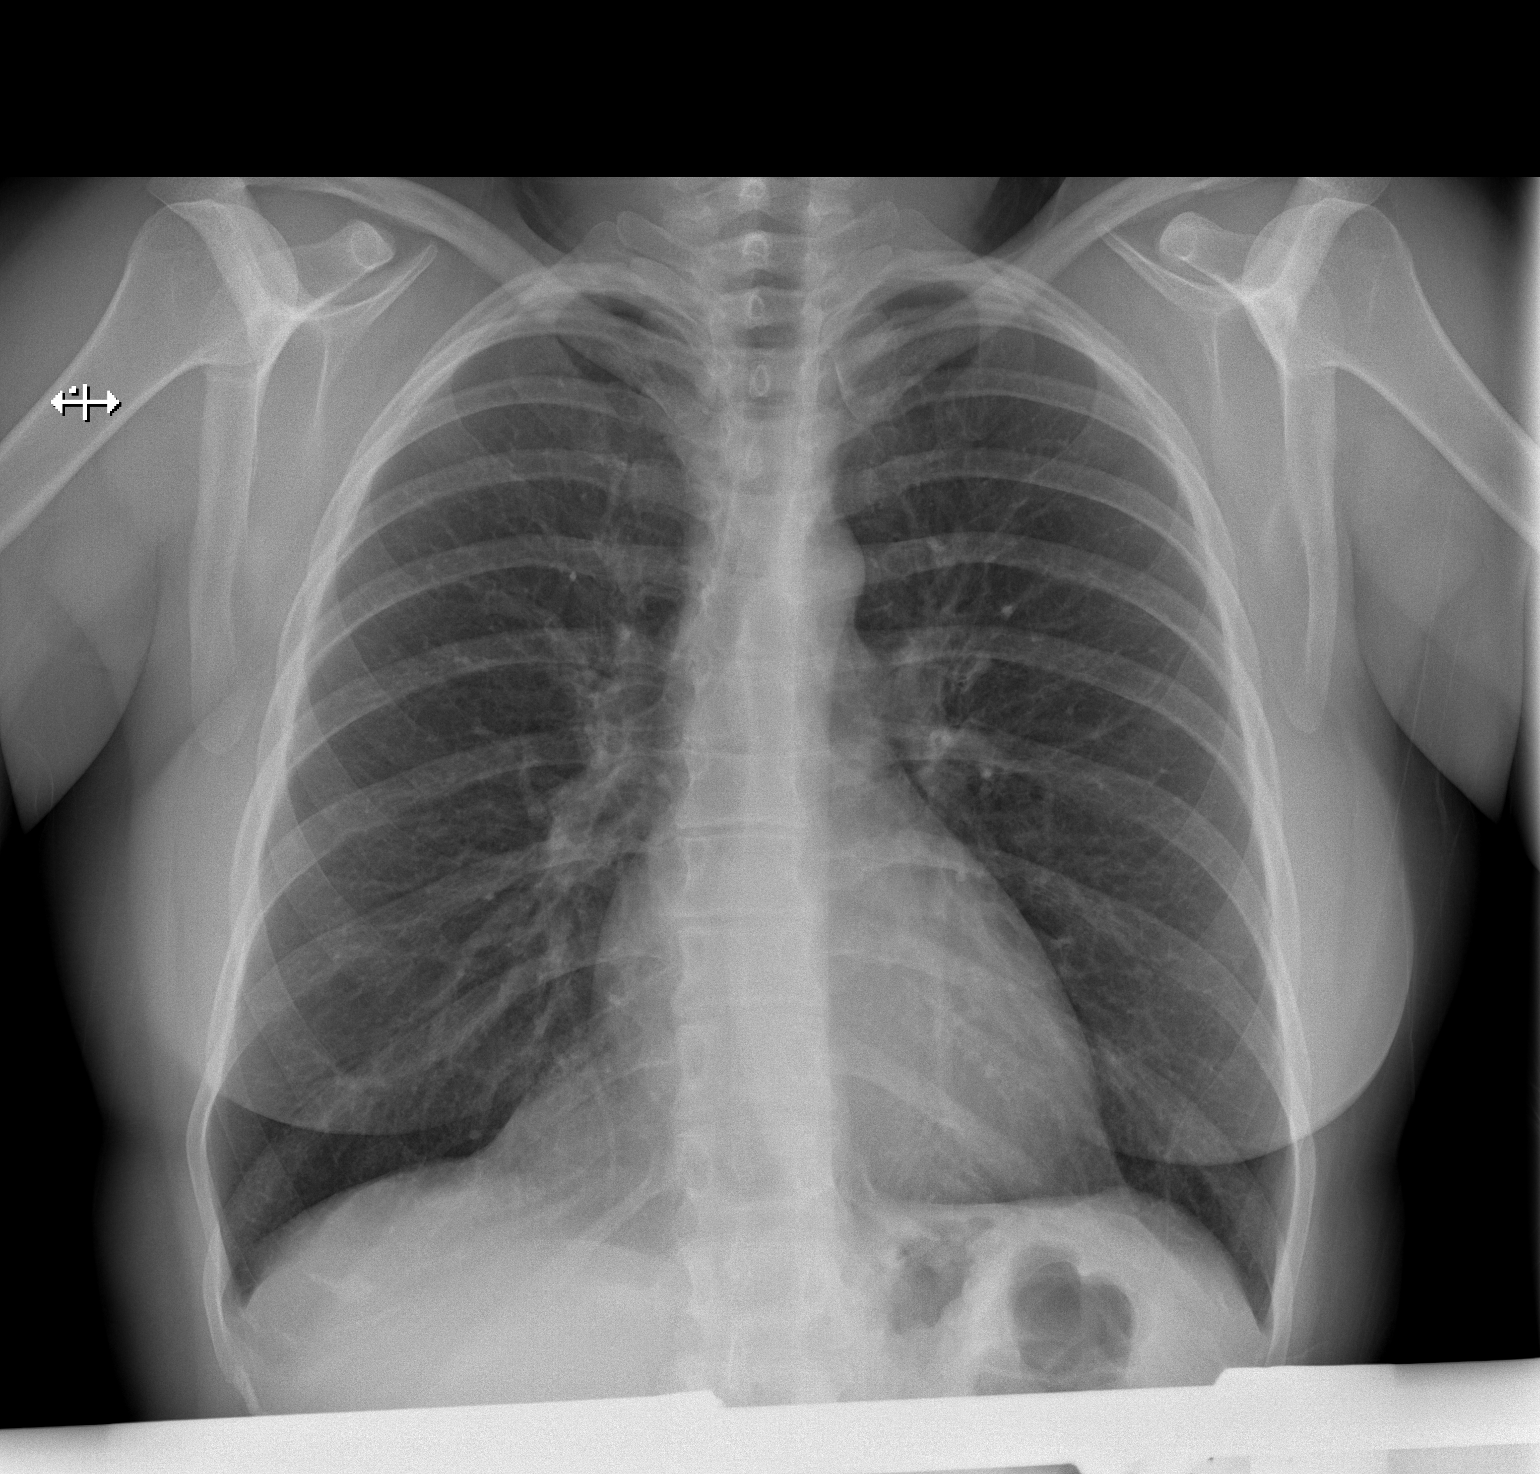

[w chest lat]
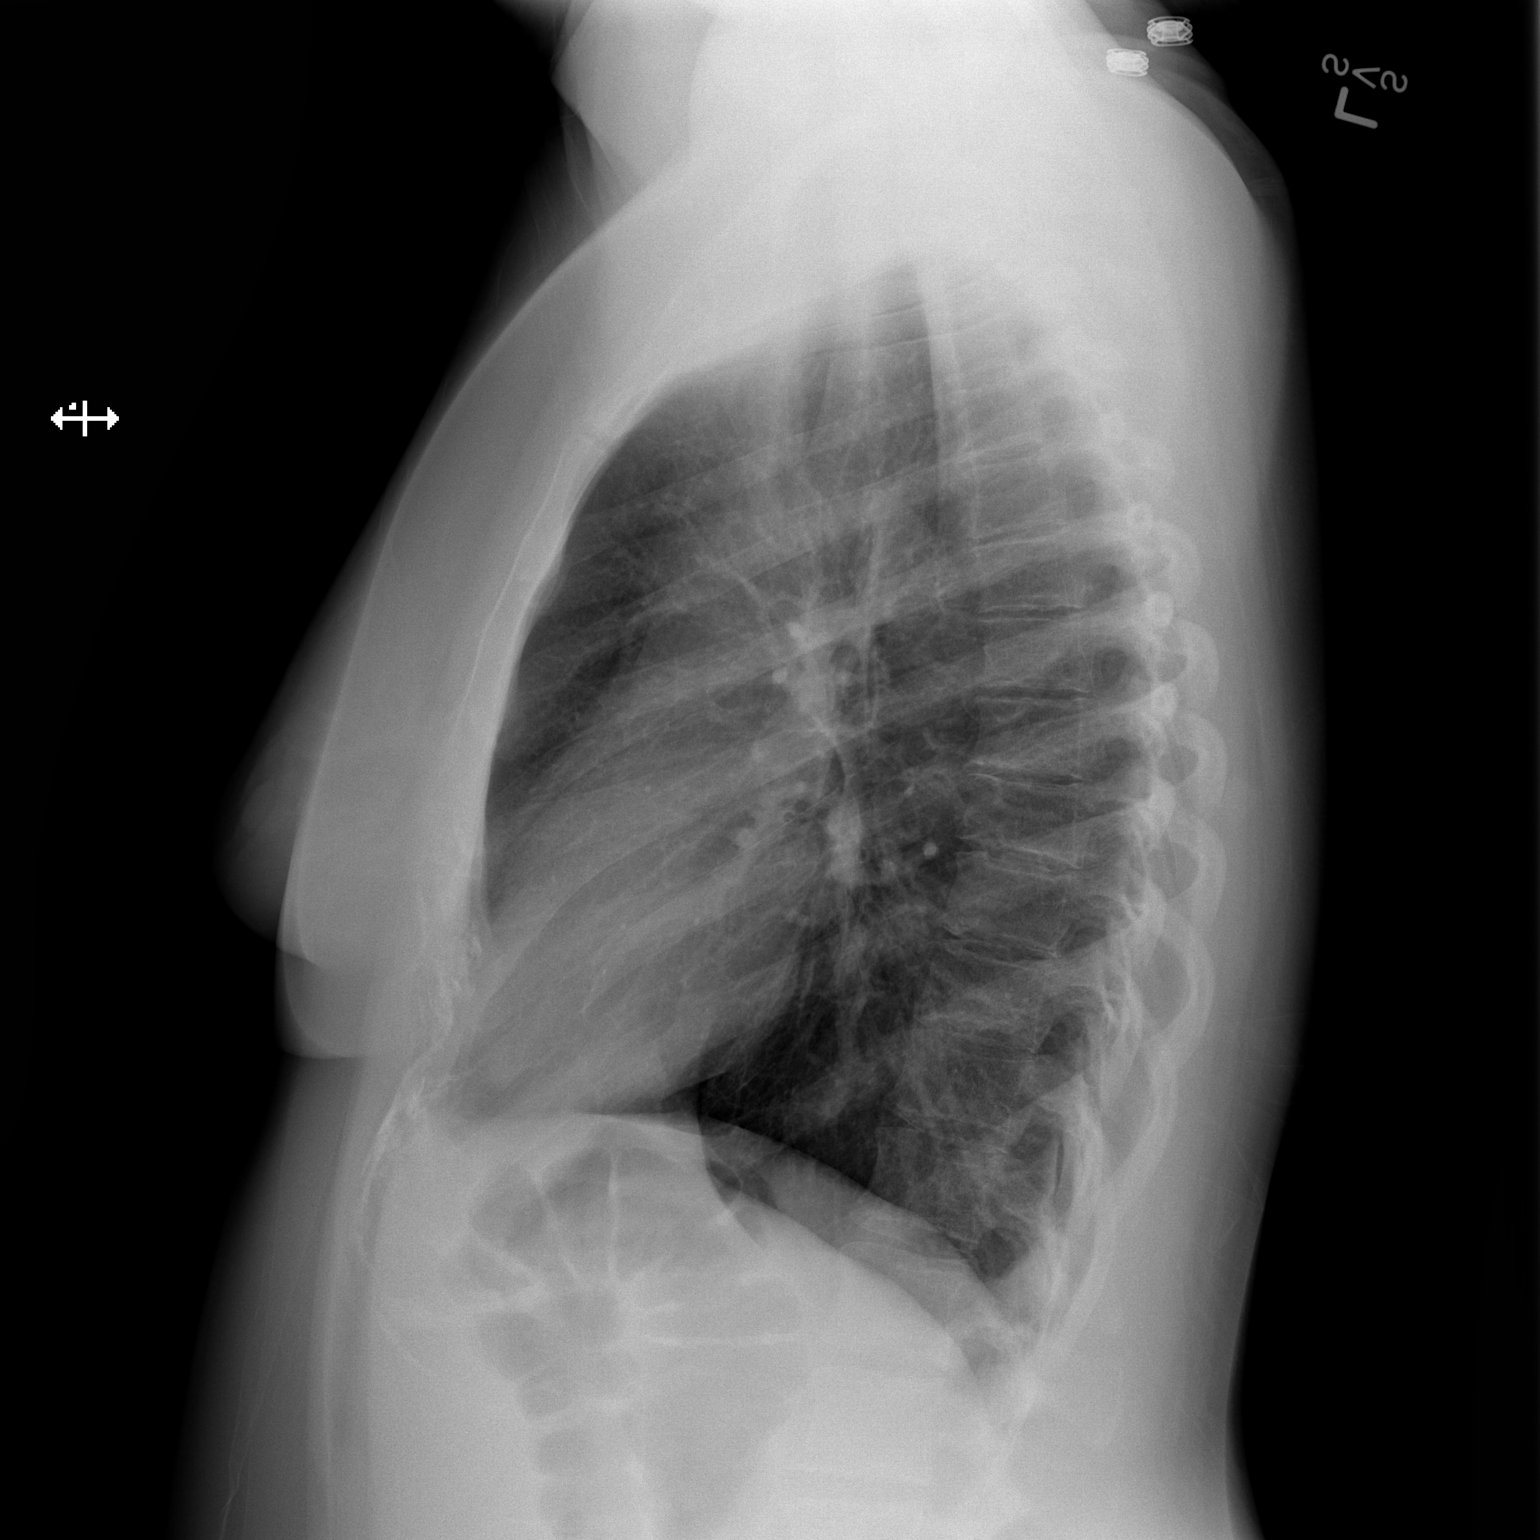

[2 of 2 positions shown; findings below may reference images not displayed]

FINDINGS: The heart size and mediastinal contours are within normal limits.
Both lungs are clear. No pleural effusion or pneumothorax. The
visualized skeletal structures are unremarkable.
IMPRESSION: No active cardiopulmonary disease.

## 2017-02-22 ENCOUNTER — Encounter (HOSPITAL_COMMUNITY): Payer: Self-pay | Admitting: Emergency Medicine

## 2017-02-22 ENCOUNTER — Emergency Department (HOSPITAL_COMMUNITY)
Admission: EM | Admit: 2017-02-22 | Discharge: 2017-02-22 | Disposition: A | Discharge status: Home / Self Care | Payer: Medicaid Other | Attending: Emergency Medicine | Admitting: Emergency Medicine

## 2017-02-22 DIAGNOSIS — Z5321 Procedure and treatment not carried out due to patient leaving prior to being seen by health care provider: Secondary | ICD-10-CM | POA: Insufficient documentation

## 2017-02-22 DIAGNOSIS — F10929 Alcohol use, unspecified with intoxication, unspecified: Secondary | ICD-10-CM | POA: Insufficient documentation
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
## 2017-03-28 ENCOUNTER — Encounter (HOSPITAL_COMMUNITY): Payer: Self-pay | Admitting: Emergency Medicine

## 2017-03-28 ENCOUNTER — Emergency Department (HOSPITAL_COMMUNITY)
Admission: EM | Admit: 2017-03-28 | Discharge: 2017-03-28 | Disposition: A | Discharge status: Home / Self Care | Payer: No Typology Code available for payment source | Attending: Emergency Medicine | Admitting: Emergency Medicine

## 2017-03-28 ENCOUNTER — Emergency Department (HOSPITAL_COMMUNITY): Payer: No Typology Code available for payment source

## 2017-03-28 DIAGNOSIS — B349 Viral infection, unspecified: Secondary | ICD-10-CM | POA: Insufficient documentation

## 2017-03-28 DIAGNOSIS — S2242XD Multiple fractures of ribs, left side, subsequent encounter for fracture with routine healing: Secondary | ICD-10-CM | POA: Insufficient documentation

## 2017-03-28 DIAGNOSIS — F17210 Nicotine dependence, cigarettes, uncomplicated: Secondary | ICD-10-CM | POA: Diagnosis not present

## 2017-03-28 DIAGNOSIS — R0781 Pleurodynia: Secondary | ICD-10-CM | POA: Insufficient documentation

## 2017-03-28 DIAGNOSIS — M25512 Pain in left shoulder: Secondary | ICD-10-CM | POA: Diagnosis not present

## 2017-03-28 DIAGNOSIS — M25511 Pain in right shoulder: Secondary | ICD-10-CM | POA: Diagnosis not present

## 2017-03-28 DIAGNOSIS — R51 Headache: Secondary | ICD-10-CM | POA: Diagnosis present

## 2017-03-28 LAB — CBC WITH DIFFERENTIAL/PLATELET
BASOS ABS: 0 10*3/uL (ref 0.0–0.1)
Basophils Relative: 0 %
Eosinophils Absolute: 0.1 10*3/uL (ref 0.0–0.7)
Eosinophils Relative: 1 %
HEMATOCRIT: 38.5 % (ref 36.0–46.0)
Hemoglobin: 13.3 g/dL (ref 12.0–15.0)
LYMPHS ABS: 1.2 10*3/uL (ref 0.7–4.0)
LYMPHS PCT: 17 %
MCH: 31.7 pg (ref 26.0–34.0)
MCHC: 34.5 g/dL (ref 30.0–36.0)
MCV: 91.7 fL (ref 78.0–100.0)
MONO ABS: 0.6 10*3/uL (ref 0.1–1.0)
MONOS PCT: 8 %
NEUTROS ABS: 5.4 10*3/uL (ref 1.7–7.7)
Neutrophils Relative %: 74 %
Platelets: 300 10*3/uL (ref 150–400)
RBC: 4.2 MIL/uL (ref 3.87–5.11)
RDW: 13.5 % (ref 11.5–15.5)
WBC: 7.3 10*3/uL (ref 4.0–10.5)

## 2017-03-28 LAB — I-STAT CG4 LACTIC ACID, ED: LACTIC ACID, VENOUS: 1.3 mmol/L (ref 0.5–1.9)

## 2017-03-28 LAB — BASIC METABOLIC PANEL
Anion gap: 9 (ref 5–15)
BUN: 6 mg/dL (ref 6–20)
CHLORIDE: 100 mmol/L — AB (ref 101–111)
CO2: 30 mmol/L (ref 22–32)
Calcium: 8.7 mg/dL — ABNORMAL LOW (ref 8.9–10.3)
Creatinine, Ser: 0.82 mg/dL (ref 0.44–1.00)
GFR calc Af Amer: >60 mL/min (ref >60)
GFR calc non Af Amer: >60 mL/min (ref >60)
Glucose, Bld: 92 mg/dL (ref 65–99)
POTASSIUM: 3.6 mmol/L (ref 3.5–5.1)
SODIUM: 139 mmol/L (ref 135–145)

## 2017-03-28 LAB — I-STAT BETA HCG BLOOD, ED (MC, WL, AP ONLY): I-stat hCG, quantitative: <5 m[IU]/mL (ref <5)

## 2017-03-28 LAB — INFLUENZA PANEL BY PCR (TYPE A & B)
Influenza A By PCR: NEGATIVE
Influenza B By PCR: NEGATIVE

## 2017-03-28 MED ORDER — OXYCODONE-ACETAMINOPHEN 5-325 MG PO TABS
1.0000 | ORAL_TABLET | Freq: Once | ORAL | Status: DC
  Filled 2017-03-28: qty 1

## 2017-03-28 MED ORDER — DEXAMETHASONE SODIUM PHOSPHATE 10 MG/ML IJ SOLN
10.0000 mg | Freq: Once | INTRAMUSCULAR | Status: AC
  Administered 2017-03-28: 10 mg via INTRAVENOUS
  Filled 2017-03-28: qty 1

## 2017-03-28 MED ORDER — DIPHENHYDRAMINE HCL 50 MG/ML IJ SOLN
25.0000 mg | Freq: Once | INTRAMUSCULAR | Status: AC
  Administered 2017-03-28: 25 mg via INTRAVENOUS
  Filled 2017-03-28: qty 1

## 2017-03-28 MED ORDER — KETOROLAC TROMETHAMINE 30 MG/ML IJ SOLN
30.0000 mg | Freq: Once | INTRAMUSCULAR | Status: AC
Start: 2017-03-28 — End: 2017-03-28
  Administered 2017-03-28: 30 mg via INTRAVENOUS
  Filled 2017-03-28: qty 1

## 2017-03-28 MED ORDER — MAGNESIUM SULFATE 2 GM/50ML IV SOLN
2.0000 g | Freq: Once | INTRAVENOUS | Status: AC
  Administered 2017-03-28: 2 g via INTRAVENOUS
  Filled 2017-03-28: qty 50

## 2017-03-28 MED ORDER — OXYCODONE-ACETAMINOPHEN 5-325 MG PO TABS: 1.0000 | ORAL_TABLET | ORAL | 0 refills | Status: DC | PRN

## 2017-03-28 MED ORDER — SODIUM CHLORIDE 0.9 % IV BOLUS (SEPSIS)
1000.0000 mL | Freq: Once | INTRAVENOUS | Status: AC
  Administered 2017-03-28: 1000 mL via INTRAVENOUS

## 2017-03-28 MED ORDER — PROCHLORPERAZINE EDISYLATE 5 MG/ML IJ SOLN
10.0000 mg | Freq: Once | INTRAMUSCULAR | Status: AC
  Administered 2017-03-28: 10 mg via INTRAVENOUS
  Filled 2017-03-28: qty 2
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
## 2017-03-28 NOTE — ED Notes (Signed)
Discharge instructions reviewed with patient. Patient verbalizes understanding. VSS.  Patient provided with incentive spirometer for home use, per order. Patient instructed on use of Incentive spirometer. Patient demonstrates appropriate use of IS prior to discharge.

## 2017-03-28 NOTE — Discharge Instructions (Signed)
Return to the emergency room for any worsening or concerning symptoms including fast breathing, heart racing, confusion, vomiting.  Rest, cover your mouth when you cough and wash your hands frequently.   Push fluids: water or Gatorade, do not drink any soda, juice or caffeinated beverages.  For fever and pain control you can take Motrin (ibuprofen) as follows: 400 mg (this is normally 2 over the counter pills) every 4 hours with food.  Do not return to work until a day after your fever breaks.   Take percocet for breakthrough pain, do not drink alcohol, drive, care for children or do other critical tasks while taking percocet.   It is very important that you take deep breaths to prevent lung collapse and infection.  Either use your incentive spirometer or take 10 deep breaths every hour to prevent lung collapse.  If you develop cough, fever or shortness of breath return immediately to the emergency room.

## 2017-07-28 ENCOUNTER — Emergency Department (HOSPITAL_COMMUNITY)
Admission: EM | Admit: 2017-07-28 | Discharge: 2017-07-28 | Discharge status: Against Medical Advice | Payer: Self-pay | Attending: Emergency Medicine | Admitting: Emergency Medicine

## 2017-07-28 ENCOUNTER — Encounter (HOSPITAL_COMMUNITY): Payer: Self-pay | Admitting: Emergency Medicine

## 2017-07-28 DIAGNOSIS — Z5321 Procedure and treatment not carried out due to patient leaving prior to being seen by health care provider: Secondary | ICD-10-CM | POA: Insufficient documentation

## 2017-07-28 DIAGNOSIS — R220 Localized swelling, mass and lump, head: Secondary | ICD-10-CM

## 2017-07-28 NOTE — ED Triage Notes (Signed)
Patient reports that during the night noticed having swelling around her lips. Her SO woke her up around 7am saying that her swelling getting worse and needs to be seen. Denies any tongue or throat swelling. Patient adds that yesterday when she got off work her feet and side were itching and noticed whelps.

## 2017-07-28 NOTE — ED Notes (Signed)
Patient reports that her ride is having to leave so she has to go. Reports that swelling has already gone done.

## 2017-07-30 NOTE — ED Provider Notes (Signed)
Pt was not seen by myself or my PA. Pt left the ER after triage, before MD or APP evaluation   Azalia Bilisampos, Trinitee Horgan, MD 07/31/17 0000

## 2017-08-03 ENCOUNTER — Emergency Department (HOSPITAL_COMMUNITY)
Admission: EM | Admit: 2017-08-03 | Discharge: 2017-08-04 | Disposition: A | Discharge status: Home / Self Care | Payer: Self-pay | Attending: Emergency Medicine | Admitting: Emergency Medicine

## 2017-08-03 ENCOUNTER — Encounter (HOSPITAL_COMMUNITY): Payer: Self-pay | Admitting: Family Medicine

## 2017-08-03 DIAGNOSIS — R21 Rash and other nonspecific skin eruption: Secondary | ICD-10-CM | POA: Insufficient documentation

## 2017-08-03 DIAGNOSIS — F1210 Cannabis abuse, uncomplicated: Secondary | ICD-10-CM | POA: Insufficient documentation

## 2017-08-03 DIAGNOSIS — F17210 Nicotine dependence, cigarettes, uncomplicated: Secondary | ICD-10-CM | POA: Insufficient documentation

## 2017-08-03 MED ORDER — PREDNISONE 20 MG PO TABS
60.0000 mg | ORAL_TABLET | Freq: Once | ORAL | Status: AC
  Administered 2017-08-04: 60 mg via ORAL
  Filled 2017-08-03: qty 3

## 2017-08-03 MED ORDER — DIPHENHYDRAMINE HCL 25 MG PO CAPS
25.0000 mg | ORAL_CAPSULE | Freq: Once | ORAL | Status: AC
  Administered 2017-08-04: 25 mg via ORAL
  Filled 2017-08-03: qty 1

## 2017-08-03 NOTE — ED Provider Notes (Signed)
[facility]-EMERGENCY DEPT Provider Note   CSN: [id number] Arrival date & time: 08/03/17  2137     History   Chief Complaint Chief Complaint  Patient presents with  . Rash    HPI RHIANA MORASH is a 27 y.o. female.  The history is provided by the patient and medical records.     27 y.o. F with hx of anxiety, polysubstance abuse, presenting to the ED with intermittent rash.  States this has been ongoing for a few weeks now.  States initially it was rash on her face with lip swelling, now that is feeling ok but she keeps breaking out in rashes all over her body.  States it looks like hives.  She denies any changes in soaps, detergents, lotions, personal care products.  No environmental or food changes.  States she has been taking some Benadryl at home but does not really feel it is helping that much.  States it also makes her drowsy and cannot take it at work.  She has no known allergies.  Past Medical History:  Diagnosis Date  . Anxiety   . Polysubstance abuse (HCC)   . Preterm labor   . Strep throat     Patient Active Problem List   Diagnosis Date Noted  . Indication for care in labor or delivery 01/27/2015  . NSVD (normal spontaneous vaginal delivery) 01/27/2015    Past Surgical History:  Procedure Laterality Date  . DILATION AND CURETTAGE OF UTERUS      OB History    Gravida Para Term Preterm AB Living   7 4 3 1 3 1    SAB TAB Ectopic Multiple Live Births   1 2   0 1       Home Medications    Prior to Admission medications   Medication Sig Start Date End Date Taking? Authorizing Provider  benzonatate (TESSALON) 100 MG capsule Take 1 capsule (100 mg total) by mouth every 8 (eight) hours. Patient not taking: Reported on 07/09/2016 09/14/15   Renne Crigler, PA-C  oxyCODONE-acetaminophen (PERCOCET) 5-325 MG tablet Take 1 tablet by mouth every 4 (four) hours as needed. 03/28/17   Pisciotta, Joni Reining, PA-C    Family History Family History    Problem Relation Age of Onset  . Other Neg Hx     Social History Social History   Tobacco Use  . Smoking status: Current Every Day Smoker    Packs/day: 0.50    Types: Cigarettes  . Smokeless tobacco: Never Used  Substance Use Topics  . Alcohol use: Yes    Comment: Weekends.   . Drug use: Yes    Types: Marijuana    Comment: Daily.      Allergies   Patient has no known allergies.   Review of Systems Review of Systems  Skin: Positive for rash.  All other systems reviewed and are negative.    Physical Exam Updated Vital Signs BP (!) 131/52 (BP Location: Left Arm)   Pulse 67   Temp 98.3 F (36.8 C) (Oral)   Resp 18   Ht 5\' 5"  (1.651 m)   Wt 65.8 kg (145 lb)   LMP 07/21/2017   SpO2 100%   BMI 24.13 kg/m   Physical Exam  Constitutional: She is oriented to person, place, and time. She appears well-developed and well-nourished.  HENT:  Head: Normocephalic and atraumatic.  Mouth/Throat: Oropharynx is clear and moist.  No lip/tongue swelling, airway is patent, handling secretions well, eating candy during exam  Eyes: Conjunctivae and EOM are normal. Pupils are equal, round, and reactive to light.  Neck: Normal range of motion.  Cardiovascular: Normal rate, regular rhythm and normal heart sounds.  Pulmonary/Chest: Effort normal and breath sounds normal.  Abdominal: Soft. Bowel sounds are normal.  Musculoskeletal: Normal range of motion.  Neurological: She is alert and oriented to person, place, and time.  Skin: Skin is warm and dry. Rash noted. Rash is urticarial.  Faint urticarial rash noted on the trunk, there are some signs of excoriation but no superimposed infection or cellulitis, no drainage or bleeding, no lesions on the palms or soles  Psychiatric: She has a normal mood and affect.  Nursing note and vitals reviewed.    ED Treatments / Results  Labs (all labs ordered are listed, but only abnormal results are displayed) Labs Reviewed - No data to  display  EKG  EKG Interpretation None       Radiology No results found.  Procedures Procedures (including critical care time)  Medications Ordered in ED Medications  diphenhydrAMINE (BENADRYL) capsule 25 mg (25 mg Oral Given 08/04/17 0022)  predniSONE (DELTASONE) tablet 60 mg (60 mg Oral Given 08/04/17 0022)     Initial Impression / Assessment and Plan / ED Course  I have reviewed the triage vital signs and the nursing notes.  Pertinent labs & imaging results that were available during my care of the patient were reviewed by me and considered in my medical decision making (see chart for details).  27 year old female here with a urticarial rash of unknown etiology.  Denies any changes in soaps, detergents, personal care products, food, or environmental changes.  She has no known allergies.  Was initially having some lip swelling, however that is resolved.  She has no oral swelling on exam, airway is patent, she is eating candy during exam without issue.  Does have faint urticarial rash across the trunk.  No lesions on the palms or soles.  Has not had much relief with Benadryl, will start prednisone taper.  She does not currently have PCP or insurance, recommended close follow-up with the wellness clinic as she may need some allergy testing in the future if source of rash cannot be identified.  Discussed plan with patient, she acknowledged understanding and agreed with plan of care.  Return precautions given for new or worsening symptoms.  Final Clinical Impressions(s) / ED Diagnoses   Final diagnoses:  Rash    ED Discharge Orders        Ordered    predniSONE (DELTASONE) 20 MG tablet     08/04/17 0030       Garlon HatchetSanders, Lisa M, PA-C 08/04/17 0200    Derwood KaplanNanavati, Ankit, MD 08/04/17 [phone]

## 2017-08-03 NOTE — ED Triage Notes (Signed)
Patient reports last Wednesday she developed swelling around her lips and rash/hives. Patient states she left prior to being seen due to the wait. She reports the swelling improved around her lips but she continues to have the rash/hives all over her body. Patient reports she has been taking Benadryl with no relief. Patient is unsure of what she is allergic to.

## 2017-08-03 NOTE — ED Notes (Signed)
Pt was called for triage and registration informed this writer that they had stepped outside

## 2017-08-04 MED ORDER — PREDNISONE 20 MG PO TABS: ORAL_TABLET | ORAL | 0 refills | Status: DC

## 2017-08-04 NOTE — Discharge Instructions (Signed)
Take the prescribed medication as directed.  Would continue benadryl at home as well. Follow-up with wellness clinic like we discussed-- call for appt. Return to the ED for new or worsening symptoms.
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
## 2020-07-13 NOTE — L&D Delivery Note (Signed)
Delivery Note Called to bedside, patient complete and pushing. At  a viable female was delivered via Vaginal, Spontaneous (Presentation: Middle Occiput Anterior).  APGAR:8,9; weight 2840g. Right compound hand noted at delivery. Placenta status: Spontaneous;Pathology, Intact.  Cord: 3 vessels with the following complications: None. TXA given for prophylaxis.  Anesthesia: Epidural Episiotomy: None Lacerations: None Est. Blood Loss (mL): 25  Mom to postpartum.  Baby to Couplet care / Skin to Skin.  Alric Seton 11/24/2020, 4:04 AM

## 2020-11-23 ENCOUNTER — Other Ambulatory Visit: Payer: Self-pay

## 2020-11-23 ENCOUNTER — Inpatient Hospital Stay (HOSPITAL_BASED_OUTPATIENT_CLINIC_OR_DEPARTMENT_OTHER): Payer: Medicaid Other

## 2020-11-23 ENCOUNTER — Inpatient Hospital Stay (HOSPITAL_COMMUNITY)
Admission: EM | Admit: 2020-11-23 | Discharge: 2020-11-26 | DRG: 806 | Disposition: A | Discharge status: Home / Self Care | Payer: Medicaid Other | Attending: Obstetrics & Gynecology | Admitting: Obstetrics & Gynecology

## 2020-11-23 ENCOUNTER — Inpatient Hospital Stay (HOSPITAL_COMMUNITY): Payer: Medicaid Other | Admitting: Anesthesiology

## 2020-11-23 ENCOUNTER — Inpatient Hospital Stay (HOSPITAL_COMMUNITY): Payer: Medicaid Other

## 2020-11-23 ENCOUNTER — Encounter (HOSPITAL_COMMUNITY): Payer: Self-pay | Admitting: Emergency Medicine

## 2020-11-23 DIAGNOSIS — O99324 Drug use complicating childbirth: Secondary | ICD-10-CM | POA: Diagnosis present

## 2020-11-23 DIAGNOSIS — A5901 Trichomonal vulvovaginitis: Secondary | ICD-10-CM | POA: Diagnosis present

## 2020-11-23 DIAGNOSIS — O1414 Severe pre-eclampsia complicating childbirth: Secondary | ICD-10-CM | POA: Diagnosis present

## 2020-11-23 DIAGNOSIS — O99892 Other specified diseases and conditions complicating childbirth: Secondary | ICD-10-CM | POA: Diagnosis not present

## 2020-11-23 DIAGNOSIS — F1113 Opioid abuse with withdrawal: Secondary | ICD-10-CM | POA: Diagnosis present

## 2020-11-23 DIAGNOSIS — R001 Bradycardia, unspecified: Secondary | ICD-10-CM | POA: Diagnosis not present

## 2020-11-23 DIAGNOSIS — Z20822 Contact with and (suspected) exposure to covid-19: Secondary | ICD-10-CM | POA: Diagnosis present

## 2020-11-23 DIAGNOSIS — O0933 Supervision of pregnancy with insufficient antenatal care, third trimester: Secondary | ICD-10-CM

## 2020-11-23 DIAGNOSIS — Z8759 Personal history of other complications of pregnancy, childbirth and the puerperium: Secondary | ICD-10-CM | POA: Diagnosis present

## 2020-11-23 DIAGNOSIS — O1413 Severe pre-eclampsia, third trimester: Secondary | ICD-10-CM | POA: Diagnosis present

## 2020-11-23 DIAGNOSIS — A568 Sexually transmitted chlamydial infection of other sites: Secondary | ICD-10-CM | POA: Diagnosis present

## 2020-11-23 DIAGNOSIS — F1910 Other psychoactive substance abuse, uncomplicated: Secondary | ICD-10-CM | POA: Diagnosis present

## 2020-11-23 DIAGNOSIS — O1410 Severe pre-eclampsia, unspecified trimester: Secondary | ICD-10-CM

## 2020-11-23 DIAGNOSIS — N179 Acute kidney failure, unspecified: Secondary | ICD-10-CM | POA: Diagnosis not present

## 2020-11-23 DIAGNOSIS — Z3A35 35 weeks gestation of pregnancy: Secondary | ICD-10-CM

## 2020-11-23 DIAGNOSIS — O9832 Other infections with a predominantly sexual mode of transmission complicating childbirth: Secondary | ICD-10-CM | POA: Diagnosis present

## 2020-11-23 DIAGNOSIS — F17210 Nicotine dependence, cigarettes, uncomplicated: Secondary | ICD-10-CM | POA: Diagnosis present

## 2020-11-23 DIAGNOSIS — Z720 Tobacco use: Secondary | ICD-10-CM | POA: Diagnosis present

## 2020-11-23 DIAGNOSIS — F1293 Cannabis use, unspecified with withdrawal: Secondary | ICD-10-CM | POA: Diagnosis present

## 2020-11-23 DIAGNOSIS — O23599 Infection of other part of genital tract in pregnancy, unspecified trimester: Secondary | ICD-10-CM | POA: Diagnosis present

## 2020-11-23 DIAGNOSIS — Z363 Encounter for antenatal screening for malformations: Secondary | ICD-10-CM

## 2020-11-23 DIAGNOSIS — O99334 Smoking (tobacco) complicating childbirth: Secondary | ICD-10-CM | POA: Diagnosis present

## 2020-11-23 DIAGNOSIS — O0930 Supervision of pregnancy with insufficient antenatal care, unspecified trimester: Secondary | ICD-10-CM

## 2020-11-23 LAB — WET PREP, GENITAL
Clue Cells Wet Prep HPF POC: NONE SEEN
Sperm: NONE SEEN
Yeast Wet Prep HPF POC: NONE SEEN

## 2020-11-23 LAB — COMPREHENSIVE METABOLIC PANEL
ALT: 17 U/L (ref 0–44)
AST: 39 U/L (ref 15–41)
Albumin: 1.9 g/dL — ABNORMAL LOW (ref 3.5–5.0)
Alkaline Phosphatase: 225 U/L — ABNORMAL HIGH (ref 38–126)
Anion gap: 9 (ref 5–15)
BUN: 16 mg/dL (ref 6–20)
CO2: 23 mmol/L (ref 22–32)
Calcium: 8.2 mg/dL — ABNORMAL LOW (ref 8.9–10.3)
Chloride: 103 mmol/L (ref 98–111)
Creatinine, Ser: 1.63 mg/dL — ABNORMAL HIGH (ref 0.44–1.00)
GFR, Estimated: 43 mL/min — ABNORMAL LOW (ref >60)
Glucose, Bld: 74 mg/dL (ref 70–99)
Potassium: 3.8 mmol/L (ref 3.5–5.1)
Sodium: 135 mmol/L (ref 135–145)
Total Bilirubin: 0.6 mg/dL (ref 0.3–1.2)
Total Protein: 6.1 g/dL — ABNORMAL LOW (ref 6.5–8.1)

## 2020-11-23 LAB — CBC WITH DIFFERENTIAL/PLATELET
Abs Immature Granulocytes: 0.04 10*3/uL (ref 0.00–0.07)
Basophils Absolute: 0 10*3/uL (ref 0.0–0.1)
Basophils Relative: 0 %
Eosinophils Absolute: 0.1 10*3/uL (ref 0.0–0.5)
Eosinophils Relative: 1 %
HCT: 34.6 % — ABNORMAL LOW (ref 36.0–46.0)
Hemoglobin: 11.2 g/dL — ABNORMAL LOW (ref 12.0–15.0)
Immature Granulocytes: 0 %
Lymphocytes Relative: 24 %
Lymphs Abs: 2.4 10*3/uL (ref 0.7–4.0)
MCH: 26.7 pg (ref 26.0–34.0)
MCHC: 32.4 g/dL (ref 30.0–36.0)
MCV: 82.4 fL (ref 80.0–100.0)
Monocytes Absolute: 0.5 10*3/uL (ref 0.1–1.0)
Monocytes Relative: 5 %
Neutro Abs: 6.9 10*3/uL (ref 1.7–7.7)
Neutrophils Relative %: 70 %
Platelets: 260 10*3/uL (ref 150–400)
RBC: 4.2 MIL/uL (ref 3.87–5.11)
RDW: 13 % (ref 11.5–15.5)
WBC: 10 10*3/uL (ref 4.0–10.5)
nRBC: 0 % (ref 0.0–0.2)

## 2020-11-23 LAB — RAPID URINE DRUG SCREEN, HOSP PERFORMED
Amphetamines: POSITIVE — AB
Barbiturates: NOT DETECTED
Benzodiazepines: NOT DETECTED
Cocaine: NOT DETECTED
Opiates: POSITIVE — AB
Tetrahydrocannabinol: POSITIVE — AB

## 2020-11-23 LAB — PROTEIN / CREATININE RATIO, URINE
Creatinine, Urine: 113.26 mg/dL
Protein Creatinine Ratio: 3.81 mg/mg{Cre} — ABNORMAL HIGH (ref 0.00–0.15)
Total Protein, Urine: 432 mg/dL

## 2020-11-23 LAB — HEPATITIS C ANTIBODY: HCV Ab: NONREACTIVE

## 2020-11-23 LAB — URINALYSIS, ROUTINE W REFLEX MICROSCOPIC
Bilirubin Urine: NEGATIVE
Glucose, UA: NEGATIVE mg/dL
Ketones, ur: NEGATIVE mg/dL
Nitrite: NEGATIVE
Protein, ur: >=300 mg/dL — AB
RBC / HPF: >50 RBC/hpf — ABNORMAL HIGH (ref 0–5)
Specific Gravity, Urine: 1.013 (ref 1.005–1.030)
WBC, UA: >50 WBC/hpf — ABNORMAL HIGH (ref 0–5)
pH: 6 (ref 5.0–8.0)

## 2020-11-23 LAB — CBC
HCT: 33.6 % — ABNORMAL LOW (ref 36.0–46.0)
Hemoglobin: 11.1 g/dL — ABNORMAL LOW (ref 12.0–15.0)
MCH: 26.4 pg (ref 26.0–34.0)
MCHC: 33 g/dL (ref 30.0–36.0)
MCV: 80 fL (ref 80.0–100.0)
Platelets: 240 10*3/uL (ref 150–400)
RBC: 4.2 MIL/uL (ref 3.87–5.11)
RDW: 13.1 % (ref 11.5–15.5)
WBC: 11.2 10*3/uL — ABNORMAL HIGH (ref 4.0–10.5)
nRBC: 0 % (ref 0.0–0.2)

## 2020-11-23 LAB — RESP PANEL BY RT-PCR (FLU A&B, COVID) ARPGX2
Influenza A by PCR: NEGATIVE
Influenza B by PCR: NEGATIVE
SARS Coronavirus 2 by RT PCR: NEGATIVE

## 2020-11-23 LAB — GLUCOSE, CAPILLARY: Glucose-Capillary: 86 mg/dL (ref 70–99)

## 2020-11-23 LAB — TYPE AND SCREEN
ABO/RH(D): O POS
Antibody Screen: NEGATIVE

## 2020-11-23 LAB — POC URINE PREG, ED: Preg Test, Ur: POSITIVE — AB

## 2020-11-23 LAB — HIV ANTIBODY (ROUTINE TESTING W REFLEX): HIV Screen 4th Generation wRfx: NONREACTIVE

## 2020-11-23 LAB — HEMOGLOBIN A1C
Hgb A1c MFr Bld: 5.7 % — ABNORMAL HIGH (ref 4.8–5.6)
Mean Plasma Glucose: 116.89 mg/dL

## 2020-11-23 LAB — ABO/RH: ABO/RH(D): O POS

## 2020-11-23 LAB — HEPATITIS B SURFACE ANTIGEN: Hepatitis B Surface Ag: NONREACTIVE

## 2020-11-23 MED ORDER — OXYTOCIN-SODIUM CHLORIDE 30-0.9 UT/500ML-% IV SOLN
1.0000 m[IU]/min | INTRAVENOUS | Status: DC
  Administered 2020-11-23: 2 m[IU]/min via INTRAVENOUS
  Filled 2020-11-23: qty 500

## 2020-11-23 MED ORDER — ONDANSETRON HCL 4 MG/2ML IJ SOLN
4.0000 mg | Freq: Four times a day (QID) | INTRAMUSCULAR | Status: DC | PRN
  Administered 2020-11-23: 4 mg via INTRAVENOUS
  Filled 2020-11-23: qty 2

## 2020-11-23 MED ORDER — LACTATED RINGERS IV SOLN: 500.0000 mL | Freq: Once | INTRAVENOUS | Status: DC

## 2020-11-23 MED ORDER — MAGNESIUM SULFATE 40 GM/1000ML IV SOLN
2.0000 g/h | INTRAVENOUS | Status: DC
Start: 2020-11-23 — End: 2020-11-24
  Administered 2020-11-23: 2 g/h via INTRAVENOUS
  Filled 2020-11-23: qty 1000

## 2020-11-23 MED ORDER — LACTATED RINGERS IV SOLN
500.0000 mL | Freq: Once | INTRAVENOUS | Status: AC
  Administered 2020-11-23: 500 mL via INTRAVENOUS

## 2020-11-23 MED ORDER — MAGNESIUM SULFATE 40 GM/1000ML IV SOLN
INTRAVENOUS | Status: AC
  Administered 2020-11-23: 4 g via INTRAVENOUS
  Filled 2020-11-23: qty 1000

## 2020-11-23 MED ORDER — TERBUTALINE SULFATE 1 MG/ML IJ SOLN: 0.2500 mg | Freq: Once | INTRAMUSCULAR | Status: DC | PRN

## 2020-11-23 MED ORDER — PHENYLEPHRINE 40 MCG/ML (10ML) SYRINGE FOR IV PUSH (FOR BLOOD PRESSURE SUPPORT)
80.0000 ug | PREFILLED_SYRINGE | INTRAVENOUS | Status: DC | PRN
  Filled 2020-11-23: qty 10

## 2020-11-23 MED ORDER — LACTATED RINGERS IV SOLN: 500.0000 mL | INTRAVENOUS | Status: DC | PRN

## 2020-11-23 MED ORDER — LORAZEPAM 2 MG/ML IJ SOLN
2.0000 mg | INTRAMUSCULAR | Status: DC | PRN
  Administered 2020-11-23: 2 mg via INTRAVENOUS
  Filled 2020-11-23: qty 1

## 2020-11-23 MED ORDER — SODIUM CHLORIDE 0.9 % IV SOLN
5.0000 10*6.[IU] | Freq: Once | INTRAVENOUS | Status: AC
  Administered 2020-11-23: 5 10*6.[IU] via INTRAVENOUS
  Filled 2020-11-23: qty 5

## 2020-11-23 MED ORDER — LIDOCAINE HCL (PF) 1 % IJ SOLN
INTRAMUSCULAR | Status: DC | PRN
  Administered 2020-11-23: 5 mL via EPIDURAL
  Administered 2020-11-23: 3 mL via EPIDURAL
  Administered 2020-11-23: 2 mL via EPIDURAL

## 2020-11-23 MED ORDER — DIPHENHYDRAMINE HCL 50 MG/ML IJ SOLN: 12.5000 mg | INTRAMUSCULAR | Status: DC | PRN

## 2020-11-23 MED ORDER — OXYCODONE-ACETAMINOPHEN 5-325 MG PO TABS: 2.0000 | ORAL_TABLET | ORAL | Status: DC | PRN

## 2020-11-23 MED ORDER — LACTATED RINGERS IV SOLN: INTRAVENOUS | Status: DC

## 2020-11-23 MED ORDER — PENICILLIN G POT IN DEXTROSE 60000 UNIT/ML IV SOLN
3.0000 10*6.[IU] | INTRAVENOUS | Status: DC
  Administered 2020-11-23 (×2): 3 10*6.[IU] via INTRAVENOUS
  Filled 2020-11-23 (×2): qty 50

## 2020-11-23 MED ORDER — FENTANYL-BUPIVACAINE-NACL 0.5-0.125-0.9 MG/250ML-% EP SOLN
12.0000 mL/h | EPIDURAL | Status: DC | PRN
  Administered 2020-11-23: 12 mL/h via EPIDURAL

## 2020-11-23 MED ORDER — EPHEDRINE 5 MG/ML INJ: 10.0000 mg | INTRAVENOUS | Status: DC | PRN

## 2020-11-23 MED ORDER — OXYCODONE-ACETAMINOPHEN 5-325 MG PO TABS: 1.0000 | ORAL_TABLET | ORAL | Status: DC | PRN

## 2020-11-23 MED ORDER — ACETAMINOPHEN 325 MG PO TABS: 650.0000 mg | ORAL_TABLET | ORAL | Status: DC | PRN

## 2020-11-23 MED ORDER — METRONIDAZOLE 500 MG PO TABS
2000.0000 mg | ORAL_TABLET | Freq: Once | ORAL | Status: AC
  Administered 2020-11-23: 2000 mg via ORAL
  Filled 2020-11-23: qty 4

## 2020-11-23 MED ORDER — MAGNESIUM SULFATE BOLUS VIA INFUSION
4.0000 g | Freq: Once | INTRAVENOUS | Status: AC
  Filled 2020-11-23: qty 1000

## 2020-11-23 MED ORDER — OXYTOCIN-SODIUM CHLORIDE 30-0.9 UT/500ML-% IV SOLN
2.5000 [IU]/h | INTRAVENOUS | Status: DC
  Administered 2020-11-24: 2.5 [IU]/h via INTRAVENOUS

## 2020-11-23 MED ORDER — LABETALOL HCL 5 MG/ML IV SOLN
80.0000 mg | INTRAVENOUS | Status: DC | PRN
  Administered 2020-11-23: 80 mg via INTRAVENOUS
  Filled 2020-11-23: qty 16

## 2020-11-23 MED ORDER — LIDOCAINE HCL (PF) 1 % IJ SOLN: 30.0000 mL | INTRAMUSCULAR | Status: DC | PRN

## 2020-11-23 MED ORDER — FLEET ENEMA 7-19 GM/118ML RE ENEM
1.0000 | ENEMA | RECTAL | Status: DC | PRN
Start: 2020-11-23 — End: 2020-11-24

## 2020-11-23 MED ORDER — LABETALOL HCL 5 MG/ML IV SOLN
20.0000 mg | INTRAVENOUS | Status: DC | PRN
  Administered 2020-11-23 – 2020-11-25 (×2): 20 mg via INTRAVENOUS
  Filled 2020-11-23 (×2): qty 4

## 2020-11-23 MED ORDER — OXYTOCIN BOLUS FROM INFUSION
333.0000 mL | Freq: Once | INTRAVENOUS | Status: AC
  Administered 2020-11-24: 333 mL via INTRAVENOUS

## 2020-11-23 MED ORDER — SOD CITRATE-CITRIC ACID 500-334 MG/5ML PO SOLN: 30.0000 mL | ORAL | Status: DC | PRN

## 2020-11-23 MED ORDER — HYDRALAZINE HCL 20 MG/ML IJ SOLN: 10.0000 mg | INTRAMUSCULAR | Status: DC | PRN

## 2020-11-23 MED ORDER — BETAMETHASONE SOD PHOS & ACET 6 (3-3) MG/ML IJ SUSP
12.0000 mg | INTRAMUSCULAR | Status: DC
  Administered 2020-11-23: 12 mg via INTRAMUSCULAR
  Filled 2020-11-23: qty 5

## 2020-11-23 MED ORDER — LABETALOL HCL 5 MG/ML IV SOLN
40.0000 mg | INTRAVENOUS | Status: DC | PRN
  Administered 2020-11-23 – 2020-11-25 (×2): 40 mg via INTRAVENOUS
  Filled 2020-11-23 (×2): qty 8

## 2020-11-23 MED ORDER — FENTANYL-BUPIVACAINE-NACL 0.5-0.125-0.9 MG/250ML-% EP SOLN
EPIDURAL | Status: AC
  Filled 2020-11-23: qty 250

## 2020-11-23 MED ORDER — PHENYLEPHRINE 40 MCG/ML (10ML) SYRINGE FOR IV PUSH (FOR BLOOD PRESSURE SUPPORT)
80.0000 ug | PREFILLED_SYRINGE | INTRAVENOUS | Status: DC | PRN
  Administered 2020-11-23: 80 ug via INTRAVENOUS

## 2020-11-23 NOTE — MAU Note (Signed)
.  [name] is a 30 y.o. at Unknown here in MAU reporting: Bilateral lower extremity swelling for three months. Patient states she has not had a period in three to four months. +FM. No VB but states she has had thick green malodorous discharge for the past "few weeks." States she has felt movement for that past two months. She also endorses vomiting all day yesterday.   LMP: unknown  Pain score: 0/10  FHT: 134 doppler

## 2020-11-23 NOTE — Progress Notes (Signed)
Labor Progress Note [name] is a 30 y.o. [id number] at [redacted]w[redacted]d presented for IOL-preE w/SF. S: Sleeping, difficult to arouse s/p ativan.  O:  BP 116/68   Pulse (!) 57   Temp 97.8 F (36.6 C) (Axillary)   Resp 18   Ht 5\' 5"  (1.651 m)   Wt 89.4 kg   LMP  (LMP Unknown)   SpO2 100%   BMI 32.78 kg/m  EFM: baseline 110bpm/min-mod variability/no accels/no decels Toco: q1-8 min  CVE: Dilation: 5 Effacement (%): 50 Cervical Position: Posterior Station: -2 Presentation: Vertex Exam by:: [email or web address], MD   A&P: 30 y.o. 26 [redacted]w[redacted]d presented for IOL-preE w/SF. #IOL: S/p FB. AROM @1730 . Patient with minimal variability, discussed case with Dr. [redacted]w[redacted]d prior to starting pitocin but given patient only 5cm and not contracting will need to augment. Will start pitocin at this time. #Pain: PRN #FWB: cat 1, intermittently cat 2 with minimal variability #GBS culture pending, PCN given preterm #Polysubstance abuse: UDS on admit positive for amphetamines, THC, opiates. Concern that patient was alert and oriented in MAU and then somnolent on presentation to L&D and that she may have used since admission, patient endorses last use yesterday. Given ativan 2mg  previously by CNM for withdrawal. Patient wakes with sternal rub. #No PNC: SW postpartum, prenatal labs pending. #preE w/ SF: Cr 1.6 and P/C 3.81 on admit. BP initially severe range, now normotensive. Asymptomatic. Continue to monitor. Mg, anti-htn. #Trichomonas in pregnancy: + on admit to L&D, treated with flagyl.   , MD 9:46 PM

## 2020-11-23 NOTE — MAU Provider Note (Signed)
History     CSN: [id number]  Arrival date and time: 11/23/20 0756   Event Date/Time   First Provider Initiated Contact with Patient 11/23/20 [phone]      Chief Complaint  Patient presents with  . Leg Swelling   HPI [name] is a 30 y.o. [id number] at Unknown who presents from the ED for hypertension & LE swelling.  Patient thinks she had a period in December but is unsure. States she's been feeling fetal movement for a few months now but hasn't had prenatal care & doesn't know how far along she is. Reports episodes of vomiting yesterday and significant bilateral LE swelling this week. Reports history of gestational hypertension but denies being told she has high blood pressure when she is pregnant. Denies headache, visual disturbance, or epigastric pain. Endorses marijuana use during the pregnancy but denies any other drug use.   OB History    Gravida  8   Para  4   Term  3   Preterm  1   AB  3   Living  4     SAB  1   IAB  2   Ectopic      Multiple  0   Live Births  1           Past Medical History:  Diagnosis Date  . Anxiety   . Polysubstance abuse (HCC)   . Preterm labor   . Strep throat     Past Surgical History:  Procedure Laterality Date  . DILATION AND CURETTAGE OF UTERUS      Family History  Problem Relation Age of Onset  . Other Neg Hx     Social History   Tobacco Use  . Smoking status: Current Every Day Smoker    Packs/day: 0.50    Types: Cigarettes  . Smokeless tobacco: Former Neurosurgeon  . Tobacco comment: smoked cigarettes a few hours ago  Vaping Use  . Vaping Use: Former  Substance Use Topics  . Alcohol use: Yes    Comment: Weekends.   . Drug use: Yes    Types: Marijuana    Comment: Daily.     Allergies: No Known Allergies  Medications Prior to Admission  Medication Sig Dispense Refill Last Dose  . benzonatate (TESSALON) 100 MG capsule Take 1 capsule (100 mg total) by mouth every 8 (eight) hours. (Patient not taking: No  sig reported) 15 capsule 0   . oxyCODONE-acetaminophen (PERCOCET) 5-325 MG tablet Take 1 tablet by mouth every 4 (four) hours as needed. 13 tablet 0   . predniSONE (DELTASONE) 20 MG tablet Take 40 mg by mouth daily for 3 days, then 20mg  by mouth daily for 3 days, then 10mg  daily for 3 days 12 tablet 0     Review of Systems  Constitutional: Negative.   Cardiovascular: Positive for leg swelling.  Gastrointestinal: Negative.   Genitourinary: Negative.    Physical Exam   Blood pressure (!) 181/106, pulse (!) 51, temperature 97.7 F (36.5 C), temperature source Oral, resp. rate 16, SpO2 100 %, unknown if currently breastfeeding.  Patient Vitals for the past 24 hrs:  BP Temp Temp src Pulse Resp SpO2  11/23/20 1016 (!) 181/106 -- -- (!) 51 -- --  11/23/20 1015 -- -- -- -- -- 100 %  11/23/20 1010 -- -- -- -- -- 100 %  11/23/20 1005 -- -- -- -- -- 100 %  11/23/20 1001 (!) 144/95 -- -- (!) 50 -- --  11/23/20 1000 -- -- -- -- --  100 %  11/23/20 0955 -- -- -- -- -- 100 %  11/23/20 0950 -- -- -- -- -- 100 %  11/23/20 0946 (!) 174/100 -- -- (!) 54 -- --  11/23/20 0945 -- -- -- -- -- 99 %  11/23/20 0940 (!) 188/101 -- -- (!) 57 -- 99 %  11/23/20 0935 -- -- -- -- -- 100 %  11/23/20 0930 -- -- -- -- -- 100 %  11/23/20 0925 -- -- -- -- -- 100 %  11/23/20 0920 (!) 168/110 -- -- (!) 57 -- 99 %  11/23/20 0805 (!) 185/114 97.7 F (36.5 C) Oral 77 16 100 %    Physical Exam Vitals and nursing note reviewed.  Constitutional:      General: She is not in acute distress.    Appearance: Normal appearance.  HENT:     Head: Normocephalic and atraumatic.  Cardiovascular:     Rate and Rhythm: Normal rate and regular rhythm.     Heart sounds: Normal heart sounds.  Pulmonary:     Effort: Pulmonary effort is normal. No respiratory distress.     Breath sounds: Normal breath sounds.  Abdominal:     Tenderness: There is no abdominal tenderness.     Comments: Fundal height 35 cm  Musculoskeletal:      Right lower leg: 2+ Pitting Edema present.     Left lower leg: 2+ Pitting Edema present.  Skin:    Findings: Burn (burn on abdomen right of umbilicus, ~1 cm) present.  Neurological:     Mental Status: She is alert.     Deep Tendon Reflexes:     Reflex Scores:      Patellar reflexes are 2+ on the right side and 2+ on the left side.    Comments: Clonus 2 beats  Psychiatric:        Mood and Affect: Mood normal.        Behavior: Behavior normal.    NST:  Baseline: 135 bpm, Variability: Good {> 6 bpm), Accelerations: Reactive and Decelerations: Absent  MAU Course  Procedures Results for orders placed or performed during the hospital encounter of 11/23/20 (from the past 24 hour(s))  POC Urine Pregnancy, ED (not at [facility])     Status: Abnormal   Collection Time: 11/23/20  8:19 AM  Result Value Ref Range   Preg Test, Ur POSITIVE (A) NEGATIVE  CBC with Differential/Platelet     Status: Abnormal   Collection Time: 11/23/20  8:36 AM  Result Value Ref Range   WBC 10.0 4.0 - 10.5 K/uL   RBC 4.20 3.87 - 5.11 MIL/uL   Hemoglobin 11.2 (L) 12.0 - 15.0 g/dL   HCT 16.1 (L) 09.6 - 04.5 %   MCV 82.4 80.0 - 100.0 fL   MCH 26.7 26.0 - 34.0 pg   MCHC 32.4 30.0 - 36.0 g/dL   RDW 40.9 81.1 - 91.4 %   Platelets 260 150 - 400 K/uL   nRBC 0.0 0.0 - 0.2 %   Neutrophils Relative % 70 %   Neutro Abs 6.9 1.7 - 7.7 K/uL   Lymphocytes Relative 24 %   Lymphs Abs 2.4 0.7 - 4.0 K/uL   Monocytes Relative 5 %   Monocytes Absolute 0.5 0.1 - 1.0 K/uL   Eosinophils Relative 1 %   Eosinophils Absolute 0.1 0.0 - 0.5 K/uL   Basophils Relative 0 %   Basophils Absolute 0.0 0.0 - 0.1 K/uL   Immature Granulocytes 0 %   Abs Immature Granulocytes  0.04 0.00 - 0.07 K/uL  Comprehensive metabolic panel     Status: Abnormal   Collection Time: 11/23/20  8:36 AM  Result Value Ref Range   Sodium 135 135 - 145 mmol/L   Potassium 3.8 3.5 - 5.1 mmol/L   Chloride 103 98 - 111 mmol/L   CO2 23 22 - 32 mmol/L   Glucose, Bld 74 70  - 99 mg/dL   BUN 16 6 - 20 mg/dL   Creatinine, Ser 1.61 (H) 0.44 - 1.00 mg/dL   Calcium 8.2 (L) 8.9 - 10.3 mg/dL   Total Protein 6.1 (L) 6.5 - 8.1 g/dL   Albumin 1.9 (L) 3.5 - 5.0 g/dL   AST 39 15 - 41 U/L   ALT 17 0 - 44 U/L   Alkaline Phosphatase 225 (H) 38 - 126 U/L   Total Bilirubin 0.6 0.3 - 1.2 mg/dL   GFR, Estimated 43 (L) >60 mL/min   Anion gap 9 5 - 15  Urinalysis, Routine w reflex microscopic Urine, Clean Catch     Status: Abnormal   Collection Time: 11/23/20  9:38 AM  Result Value Ref Range   Color, Urine YELLOW YELLOW   APPearance TURBID (A) CLEAR   Specific Gravity, Urine 1.013 1.005 - 1.030   pH 6.0 5.0 - 8.0   Glucose, UA NEGATIVE NEGATIVE mg/dL   Hgb urine dipstick MODERATE (A) NEGATIVE   Bilirubin Urine NEGATIVE NEGATIVE   Ketones, ur NEGATIVE NEGATIVE mg/dL   Protein, ur >=096 (A) NEGATIVE mg/dL   Nitrite NEGATIVE NEGATIVE   Leukocytes,Ua LARGE (A) NEGATIVE   RBC / HPF >50 (H) 0 - 5 RBC/hpf   WBC, UA >50 (H) 0 - 5 WBC/hpf   Bacteria, UA FEW (A) NONE SEEN   Squamous Epithelial / LPF 21-50 0 - 5   WBC Clumps PRESENT   Protein / creatinine ratio, urine     Status: Abnormal   Collection Time: 11/23/20  9:38 AM  Result Value Ref Range   Creatinine, Urine 113.26 mg/dL   Total Protein, Urine 432 mg/dL   Protein Creatinine Ratio 3.81 (H) 0.00 - 0.15 mg/mg[Cre]  Rapid urine drug screen (hospital performed)     Status: Abnormal   Collection Time: 11/23/20  9:38 AM  Result Value Ref Range   Opiates POSITIVE (A) NONE DETECTED   Cocaine NONE DETECTED NONE DETECTED   Benzodiazepines NONE DETECTED NONE DETECTED   Amphetamines POSITIVE (A) NONE DETECTED   Tetrahydrocannabinol POSITIVE (A) NONE DETECTED   Barbiturates NONE DETECTED NONE DETECTED  ABO/Rh     Status: None   Collection Time: 11/23/20  9:50 AM  Result Value Ref Range   ABO/RH(D)      O POS Performed at [facility], [address] [place], [place] [address]   Wet prep, genital     Status:  Abnormal   Collection Time: 11/23/20 10:00 AM   Specimen: Vaginal/Rectal  Result Value Ref Range   Yeast Wet Prep HPF POC NONE SEEN NONE SEEN   Trich, Wet Prep PRESENT (A) NONE SEEN   Clue Cells Wet Prep HPF POC NONE SEEN NONE SEEN   WBC, Wet Prep HPF POC MANY (A) NONE SEEN   Sperm NONE SEEN   Hemoglobin A1c     Status: Abnormal   Collection Time: 11/23/20 10:15 AM  Result Value Ref Range   Hgb A1c MFr Bld 5.7 (H) 4.8 - 5.6 %   Mean Plasma Glucose 116.89 mg/dL   DG Chest [facility]  Result Date: 11/23/2020 CLINICAL DATA:  Bilateral LOWER extremity swelling. Pregnant. Double shielded. EXAM: PORTABLE CHEST 1 VIEW COMPARISON:  03/28/2017 FINDINGS: Heart size is normal. The lungs are free of focal consolidations and pleural effusions. No pulmonary edema. Remote RIGHT 8 rib fracture. IMPRESSION: No evidence for acute cardiopulmonary abnormality. Electronically Signed   By: Norva Pavlov M.D.   On: 11/23/2020 09:55   [place] MFM FETAL BPP WO NON STRESS  Result Date: 11/23/2020 ----------------------------------------------------------------------  OBSTETRICS REPORT                         (Signed Final 11/23/2020 11:00 am) ---------------------------------------------------------------------- Patient Info  ID #:        [id number]                          D.O.B.:  [birth year] (29 yrs)  Name:        [name]                Visit Date: 11/23/2020 09:42 am ---------------------------------------------------------------------- Performed By  Attending:         Noralee Space MD        Ref. Address:      [address]  Performed By:      Birdena Crandall        Secondary Phy.:    [facility] MAU/Triage                     RDMS,RVT  Referred By:       Judeth Horn          [facility]  ---------------------------------------------------------------------- Orders  #   Description                          Code         Ordered By  1   [place] MFM FETAL BPP WO NON              76819.01     Judeth Horn      STRESS  2   [place] MFM OB DETAIL +14 WK              76811.01     Judeth Horn ----------------------------------------------------------------------  #   Order #                    Accession #                 Episode #  1   [id number]                  [id number]  [id number]  2   [id number]                  [id number]                  [id number] ---------------------------------------------------------------------- Indications  Unspecified preeclampsia, third trimester       O14.93  Unspecified maternal hypertension, third        O16.3  trimester  Insufficient Prenatal Care                      O09.30  Encounter for uncertain dates                   Z36.87  Encounter for antenatal screening for           Z36.3  malformations  [redacted] weeks gestation of pregnancy                 Z3A.35 ---------------------------------------------------------------------- Fetal Evaluation  Num Of Fetuses:          1  Fetal Heart Rate(bpm):   137  Cardiac Activity:        Observed  Presentation:            Cephalic  Placenta:                Anterior  P. Cord Insertion:       Not well visualized  Amniotic Fluid  AFI FV:      Subjectively low-normal  AFI Sum(cm)     %Tile       Largest Pocket(cm)  9.61            18          3.93  RUQ(cm)       RLQ(cm)        LUQ(cm)        LLQ(cm)  3.93          1.78           2.26           1.64 ---------------------------------------------------------------------- Biophysical Evaluation  Amniotic F.V:   Within normal limits        F. Tone:         Observed  F. Movement:    Observed                    Score:           8/8  F. Breathing:   Observed ---------------------------------------------------------------------- Biometry  BPD:      88.5   mm     G. Age:  35w 5d         62  %     CI:         76.83  %    70 - 86                                                           FL/HC:       21.4  %    20.1 - 22.1  HC:      319.8   mm     G. Age:  36w 0d         29  %    HC/AC:       1.03  0.93 - 1.11  AC:      311.9   mm     G. Age:  35w 1d         46  %    FL/BPD:      77.2  %    71 - 87  FL:       68.3   mm     G. Age:  35w 1d         31  %    FL/AC:       21.9  %    20 - 24  Est. FW:    2638   gm    5 lb 13 oz      41  % ---------------------------------------------------------------------- OB History  Gravidity:     8         Term:  3          Prem:  1        SAB:   1  TOP:           2        Living: 4 ---------------------------------------------------------------------- Gestational Age  U/S Today:      35w 4d                                         EDD:  12/24/20  Best:           35w 4d    Det. By:  U/S (11/23/20)             EDD:  12/24/20 ---------------------------------------------------------------------- Anatomy  Cranium:                Not well visualized    LVOT:                   Appears normal  Cavum:                  Not well visualized    Aortic Arch:            Not well visualized  Ventricles:             Not well visualized    Ductal Arch:            Appears normal  Choroid Plexus:         Not well visualized    Diaphragm:              Appears normal  Cerebellum:             Not well visualized    Stomach:                Appears normal, left                                                                         sided  Posterior Fossa:        Not well visualized    Abdomen:                Appears normal  Nuchal Fold:  Not applicable (>20    Abdominal Wall:         Appears nml (cord                          wks GA)                                        insert, abd wall)  Face:                   Orbits appear normal   Cord Vessels:           Appears normal (3                                                                         vessel cord)  Lips:                    Not well visualized    Kidneys:                Appear normal  Palate:                 Appears normal         Bladder:                Appears normal  Thoracic:               Appears normal         Spine:                  Ltd views no                                                                         intracranial signs of                                                                         NTD  Heart:                  Appears normal         Upper Extremities:      Not well visualized                          (4CH, axis, and situs)  RVOT:                   Appears normal         Lower Extremities:      Not well visualized  Other:  Technicallly difficult due to advanced GA and maternal habitus.           Technically difficult due to fetal position. ---------------------------------------------------------------------- Cervix Uterus Adnexa  Cervix  Not visualized (advanced GA >24wks) ---------------------------------------------------------------------- Impression  Patient is being evaluated at the MAU for c/o swollen legs. She  has severe range hypertension.  Amniotic fluid is normal and good fetal activity is seen .Fetal  growth is appropriate for gestational age .Fetal anatomical  survey appears normal but is limited because of advanced  gestational age.  Antenatal testing is reassuring. BPP 8/8.  Cephalic presentation. ---------------------------------------------------------------------- Recommendations  If the diagnosis is consistent with severe gestational  hypertension/preeclampsia, delivery is recommended. ----------------------------------------------------------------------                  Noralee Space, MD Electronically Signed Final Report   11/23/2020 11:00 am ----------------------------------------------------------------------  [place] MFM OB DETAIL +14 WK  Result Date: 11/23/2020 ----------------------------------------------------------------------  OBSTETRICS REPORT                          (Signed Final 11/23/2020 11:00 am) ---------------------------------------------------------------------- Patient Info  ID #:        [id number]                          D.O.B.:  1991/06/24 (29 yrs)  Name:        [name]                Visit Date: 11/23/2020 09:42 am ---------------------------------------------------------------------- Performed By  Attending:         Noralee Space MD        Ref. Address:      [address]  Performed By:      Birdena Crandall        Secondary Phy.:    [facility] MAU/Triage                     RDMS,RVT  Referred By:       Judeth Horn          [facility] ---------------------------------------------------------------------- Orders  #   Description                          Code         Ordered By  1   [place] MFM FETAL BPP WO NON              76819.01     Amad Mau      STRESS  2   [place] MFM OB DETAIL +14 WK  02637.85     Judeth Horn ----------------------------------------------------------------------  #   Order #                    Accession #                 Episode #  1   [id number]                  [id number]                  [id number]  2   [id number]                  [id number]                  [id number] ---------------------------------------------------------------------- Indications  Unspecified preeclampsia, third trimester       O14.93  Unspecified maternal hypertension, third        O16.3  trimester  Insufficient Prenatal Care                      O09.30  Encounter for uncertain dates                   Z36.87  Encounter for antenatal screening for           Z36.3  malformations  [redacted] weeks gestation of pregnancy                 Z3A.35 ---------------------------------------------------------------------- Fetal Evaluation  Num Of Fetuses:          1  Fetal Heart Rate(bpm):   137  Cardiac Activity:         Observed  Presentation:            Cephalic  Placenta:                Anterior  P. Cord Insertion:       Not well visualized  Amniotic Fluid  AFI FV:      Subjectively low-normal  AFI Sum(cm)     %Tile       Largest Pocket(cm)  9.61            18          3.93  RUQ(cm)       RLQ(cm)        LUQ(cm)        LLQ(cm)  3.93          1.78           2.26           1.64 ---------------------------------------------------------------------- Biophysical Evaluation  Amniotic F.V:   Within normal limits        F. Tone:         Observed  F. Movement:    Observed                    Score:           8/8  F. Breathing:   Observed ---------------------------------------------------------------------- Biometry  BPD:      88.5   mm     G. Age:  35w 5d         62  %    CI:         76.83  %    70 - 86  FL/HC:       21.4  %    20.1 - 22.1  HC:      319.8   mm     G. Age:  36w 0d         29  %    HC/AC:       1.03       0.93 - 1.11  AC:      311.9   mm     G. Age:  35w 1d         46  %    FL/BPD:      77.2  %    71 - 87  FL:       68.3   mm     G. Age:  35w 1d         31  %    FL/AC:       21.9  %    20 - 24  Est. FW:    2638   gm    5 lb 13 oz      41  % ---------------------------------------------------------------------- OB History  Gravidity:     8         Term:  3          Prem:  1        SAB:   1  TOP:           2        Living: 4 ---------------------------------------------------------------------- Gestational Age  U/S Today:      35w 4d                                         EDD:  12/24/20  Best:           35w 4d    Det. By:  U/S (11/23/20)             EDD:  12/24/20 ---------------------------------------------------------------------- Anatomy  Cranium:                Not well visualized    LVOT:                   Appears normal  Cavum:                  Not well visualized    Aortic Arch:            Not well visualized  Ventricles:             Not well visualized     Ductal Arch:            Appears normal  Choroid Plexus:         Not well visualized    Diaphragm:              Appears normal  Cerebellum:             Not well visualized    Stomach:                Appears normal, left  sided  Posterior Fossa:        Not well visualized    Abdomen:                Appears normal  Nuchal Fold:            Not applicable (>20    Abdominal Wall:         Appears nml (cord                          wks GA)                                        insert, abd wall)  Face:                   Orbits appear normal   Cord Vessels:           Appears normal (3                                                                         vessel cord)  Lips:                   Not well visualized    Kidneys:                Appear normal  Palate:                 Appears normal         Bladder:                Appears normal  Thoracic:               Appears normal         Spine:                  Ltd views no                                                                         intracranial signs of                                                                         NTD  Heart:                  Appears normal         Upper Extremities:      Not well visualized                          (  4CH, axis, and situs)  RVOT:                   Appears normal         Lower Extremities:      Not well visualized  Other:   Technicallly difficult due to advanced GA and maternal habitus.           Technically difficult due to fetal position. ---------------------------------------------------------------------- Cervix Uterus Adnexa  Cervix  Not visualized (advanced GA >24wks) ---------------------------------------------------------------------- Impression  Patient is being evaluated at the MAU for c/o swollen legs. She  has severe range hypertension.  Amniotic fluid is normal and good fetal activity is seen .Fetal  growth is appropriate for gestational  age .Fetal anatomical  survey appears normal but is limited because of advanced  gestational age.  Antenatal testing is reassuring. BPP 8/8.  Cephalic presentation. ---------------------------------------------------------------------- Recommendations  If the diagnosis is consistent with severe gestational  hypertension/preeclampsia, delivery is recommended. ----------------------------------------------------------------------                  Noralee Spaceavi Shankar, MD Electronically Signed Final Report   11/23/2020 11:00 am ----------------------------------------------------------------------   MDM Patient evaluated for hypertension. Given IV labetalol for severe range BPs. Given BPs & labs has diagnosis of severe preeclampsia, questionable if chtn with siPreE.   No prenatal care. Official ultrasound shows baby is 1462w4d.   Wet prep positive for trichomonas - flagyl 2 gm ordered  UDS positive for MJ,opiates, and amphetamines. Patient denies use of any drugs other than marijuana. Appears mores sedated throughout her MAU visit.   Bile acids ordered due to complaint of itching hands but unlikely to result before she delivers.   Assessment and Plan   1. Preeclampsia, severe   2. No prenatal care in current pregnancy in third trimester   3. [redacted] weeks gestation of pregnancy    -admit to birthing suites for induction -start mag sulfate for severe PreE -care turned over to labor team  Judeth Hornrin Beonka Amesquita 11/23/2020, 11:06 AM

## 2020-11-23 NOTE — ED Triage Notes (Signed)
Pt reports BLE swelling x 2-3 months. Reports no period in 3.5 months. Denies pain unless standing for long periods of time.

## 2020-11-23 NOTE — ED Provider Notes (Signed)
Emergency Medicine Provider OB Triage Evaluation Note  [name] is a 30 y.o. female, [phone], at Unknown gestation who presents to the emergency department with complaints of pregnancy and leg swelling  Review of  Systems  Positive:leg swelling Negative: no shortness of breath  Physical Exam  BP (!) 185/114   Pulse 77   Temp 97.7 F (36.5 C) (Oral)   Resp 16   LMP 08/25/2020   SpO2 100%  General: Awake, no distress  HEENT: Atraumatic  Resp: Normal effort  Cardiac: Normal rate Abd: Nondistended, nontender  MSK: Moves all extremities without difficulty Neuro: Speech clear  Medical Decision Making  Pt evaluated for pregnancy concern and is stable for transfer to MAU. Pt is in agreement with plan for transfer.  8:57 AM Discussed with MAU APP, , who accepts patient in transfer.  Clinical Impression  No diagnosis found.     Elson Areas, PA-C 11/23/20 0858    Virgina Norfolk, DO 11/23/20 1338

## 2020-11-23 NOTE — Progress Notes (Signed)
Labor Progress Note [name] is a 30 y.o. D2K0254 at [redacted]w[redacted]d presented for IOL for preeclampsia with severe features  S:  Patient sleeping, reports epidural is working well when aroused. FB out  O:  BP 114/68   Pulse 61   Temp 98 F (36.7 C) (Oral)   Resp 15   Ht 5\' 5"  (1.651 m)   Wt 89.4 kg   LMP  (LMP Unknown)   SpO2 100%   BMI 32.78 kg/m   Fetal Tracing:  Baseline: 110 Variability: minimal/moderate Accels: none Decels: none  Toco: occasional uc's   CVE: Dilation: 4 Effacement (%): 90 Cervical Position: Posterior Station: -3 Presentation: Vertex Exam by:: [email or web address] CNM   A&P: 30 y.o. 26 [redacted]w[redacted]d IOL preeclampsia with severe features #Labor: Progressing well. Discussed with patient risks and benefits of AROM for augmentation of labor. Patient agreeable to plan of care. AROM with moderate amount of clear fluid. Patient and FHR tolerated procedure well.  Will manage expectantly and reassess contraction pattern in 1 hour #Pain: epidural #FWB: Cat II #GBS pending, PCN  [redacted]w[redacted]d, CNM 5:42 PM

## 2020-11-23 NOTE — Progress Notes (Signed)
After epidural placement, patient agreeable to foley balloon placement. Discussed with patient placement of foley balloon for cervical ripening. Risks and benefits reviewed. Patient agreeable to plan of care. Foley balloon inserted without difficulty and inflated with 60cc sterile water. Patient tolerated procedure well.   Will hold pitocin at this time due to Cat II fetal tracing. Dr. Debroah Loop made aware of tracing and plan of care.   Rolm Bookbinder, CNM 11/23/20 4:42 PM

## 2020-11-23 NOTE — Anesthesia Procedure Notes (Signed)
Epidural Patient location during procedure: OB Start time: 11/23/2020 2:37 PM End time: 11/23/2020 2:44 PM  Staffing Anesthesiologist: Marcene Duos, MD Performed: anesthesiologist   Preanesthetic Checklist Completed: patient identified, IV checked, site marked, risks and benefits discussed, surgical consent, monitors and equipment checked, pre-op evaluation and timeout performed  Epidural Patient position: sitting Prep: DuraPrep and site prepped and draped Patient monitoring: continuous pulse ox and blood pressure Approach: midline Location: L4-L5 Injection technique: LOR air  Needle:  Needle type: Tuohy  Needle gauge: 17 G Needle length: 9 cm and 9 Needle insertion depth: 6 cm Catheter type: closed end flexible Catheter size: 19 Gauge Catheter at skin depth: 11 cm Test dose: negative  Assessment Events: blood not aspirated, injection not painful, no injection resistance, no paresthesia and negative IV test

## 2020-11-23 NOTE — Progress Notes (Signed)
Labor Progress Note [name] is a 30 y.o. H6K0881 at [redacted]w[redacted]d presented for preeclampsia with severe features  S:  Patient now awake, antsy and feeling like she is withdrawing. Despite epidural, patient able to move all around bed and attempting to get out of the bed.   O:  BP 118/72   Pulse (!) 59   Temp 97.8 F (36.6 C) (Axillary)   Resp 14   Ht 5\' 5"  (1.651 m)   Wt 89.4 kg   LMP  (LMP Unknown)   SpO2 100%   BMI 32.78 kg/m   Fetal Tracing:  Baseline: 110 Variability: moderate Accels: 10x10 Decels: none  Toco: 5-6   CVE: Dilation: 4 Effacement (%): 90 Cervical Position: Posterior Station: -3 Presentation: Vertex Exam by:: [email or web address] CNM   A&P: 30 y.o. 26 [redacted]w[redacted]d preeclampsia with severe features, now withdrawing from polysubstance abuse #Labor: Continue expectant management. Ativan PRN for symptoms  #Pain: epidural #FWB: Cat II #GBS unknown, PCN  [redacted]w[redacted]d, CNM 7:46 PM

## 2020-11-23 NOTE — H&P (Signed)
OBSTETRIC ADMISSION HISTORY AND PHYSICAL  [name] is a 30 y.o. female [phone] with IUP at [redacted]w[redacted]d by third trimester ultrasound presenting for IOL for preeclampsia with severe features. She reports +FMs, No LOF, no VB, no blurry vision, headaches or peripheral edema, and RUQ pain.  She plans on bottle feeding. She request pills for birth control. She did not receive prenatal care  Dating: By ultrasound on 5/14 --->  Estimated Date of Delivery: 12/28/20  Sono:    @[redacted]w[redacted]d , CWD, normal anatomy, cephalic presentation, 2638g, 06[birth year]22[birth year]1998 EFW   Prenatal History/Complications: polysubstance abuse, no prenatal care  Past Medical History: Past Medical History:  Diagnosis Date  . Anxiety   . Polysubstance abuse (HCC)   . Preterm labor   . Strep throat     Past Surgical History: Past Surgical History:  Procedure Laterality Date  . DILATION AND CURETTAGE OF UTERUS      Obstetrical History: OB History    Gravida  8   Para  4   Term  3   Preterm  1   AB  3   Living  4     SAB  1   IAB  2   Ectopic      Multiple  0   Live Births  1           Social History Social History   Socioeconomic History  . Marital status: Single    Spouse name: Not on file  . Number of children: Not on file  . Years of education: Not on file  . Highest education level: Not on file  Occupational History  . Not on file  Tobacco Use  . Smoking status: Current Every Day Smoker    Packs/day: 0.50    Types: Cigarettes  . Smokeless tobacco: Former 23%  . Tobacco comment: smoked cigarettes a few hours ago  Vaping Use  . Vaping Use: Former  Substance and Sexual Activity  . Alcohol use: Yes    Comment: Weekends.   . Drug use: Yes    Types: Marijuana    Comment: Daily.   Neurosurgeon Sexual activity: Yes    Birth control/protection: None  Other Topics Concern  . Not on file  Social History Narrative  . Not on file   Social Determinants of Health   Financial Resource Strain: Not on file   Food Insecurity: Not on file  Transportation Needs: Not on file  Physical Activity: Not on file  Stress: Not on file  Social Connections: Not on file    Family History: Family History  Problem Relation Age of Onset  . Other Neg Hx     Allergies: No Known Allergies  Medications Prior to Admission  Medication Sig Dispense Refill Last Dose  . benzonatate (TESSALON) 100 MG capsule Take 1 capsule (100 mg total) by mouth every 8 (eight) hours. (Patient not taking: No sig reported) 15 capsule 0   . oxyCODONE-acetaminophen (PERCOCET) 5-325 MG tablet Take 1 tablet by mouth every 4 (four) hours as needed. 13 tablet 0   . predniSONE (DELTASONE) 20 MG tablet Take 40 mg by mouth daily for 3 days, then 20mg  by mouth daily for 3 days, then 10mg  daily for 3 days 12 tablet 0      Review of Systems   All systems reviewed and negative except as stated in HPI  Blood pressure 131/83, pulse 60, temperature 97.6 F (36.4 C), temperature source Oral, resp. rate 16, SpO2 99 %, unknown if currently breastfeeding.  General appearance: cooperative, fatigued, no distress and solomnent  Lungs: clear to auscultation bilaterally Heart: regular rate and rhythm Abdomen: soft, non-tender; bowel sounds normal Pelvic: n/a Extremities: Homans sign is negative, no sign of DVT DTR's +3 Presentation: cephalic Fetal monitoringBaseline: 120 bpm, Variability: Good {> 6 bpm), Accelerations: none and Decelerations: Absent Uterine activityNone Dilation: 1 Effacement (%): 90 Station: -3 Exam by:: Antony Odeaaroline Neil CNM   Prenatal labs: PENDING ABO, Rh: --/--/O POS, O POS (05/14 0950) Antibody: NEG (05/14 0950) Rubella:   RPR:    HBsAg: NON REACTIVE (05/14 1015)  HIV: Non Reactive (05/14 1015)  GBS:      Prenatal Transfer Tool  Maternal Diabetes: unknown Genetic Screening: not done Maternal Ultrasounds/Referrals: n/a Fetal Ultrasounds or other Referrals:  n/a Maternal Substance Abuse:  Yes:  Type: Marijuana,  Other:  amphetamines, opiates Significant Maternal Medications:  None Significant Maternal Lab Results: None  Results for orders placed or performed during the hospital encounter of 11/23/20 (from the past 24 hour(s))  POC Urine Pregnancy, ED ([facility])   Collection Time: 11/23/20  8:19 AM  Result Value Ref Range   Preg Test, Ur POSITIVE (A) NEGATIVE  CBC with Differential/Platelet   Collection Time: 11/23/20  8:36 AM  Result Value Ref Range   WBC 10.0 4.0 - 10.5 K/uL   RBC 4.20 3.87 - 5.11 MIL/uL   Hemoglobin 11.2 (L) 12.0 - 15.0 g/dL   HCT 95.634.6 (L) 21.336.0 - 08.646.0 %   MCV 82.4 80.0 - 100.0 fL   MCH 26.7 26.0 - 34.0 pg   MCHC 32.4 30.0 - 36.0 g/dL   RDW 57.813.0 46.911.5 - 62.915.5 %   Platelets 260 150 - 400 K/uL   nRBC 0.0 0.0 - 0.2 %   Neutrophils Relative % 70 %   Neutro Abs 6.9 1.7 - 7.7 K/uL   Lymphocytes Relative 24 %   Lymphs Abs 2.4 0.7 - 4.0 K/uL   Monocytes Relative 5 %   Monocytes Absolute 0.5 0.1 - 1.0 K/uL   Eosinophils Relative 1 %   Eosinophils Absolute 0.1 0.0 - 0.5 K/uL   Basophils Relative 0 %   Basophils Absolute 0.0 0.0 - 0.1 K/uL   Immature Granulocytes 0 %   Abs Immature Granulocytes 0.04 0.00 - 0.07 K/uL  Comprehensive metabolic panel   Collection Time: 11/23/20  8:36 AM  Result Value Ref Range   Sodium 135 135 - 145 mmol/L   Potassium 3.8 3.5 - 5.1 mmol/L   Chloride 103 98 - 111 mmol/L   CO2 23 22 - 32 mmol/L   Glucose, Bld 74 70 - 99 mg/dL   BUN 16 6 - 20 mg/dL   Creatinine, Ser 5.281.63 (H) 0.44 - 1.00 mg/dL   Calcium 8.2 (L) 8.9 - 10.3 mg/dL   Total Protein 6.1 (L) 6.5 - 8.1 g/dL   Albumin 1.9 (L) 3.5 - 5.0 g/dL   AST 39 15 - 41 U/L   ALT 17 0 - 44 U/L   Alkaline Phosphatase 225 (H) 38 - 126 U/L   Total Bilirubin 0.6 0.3 - 1.2 mg/dL   GFR, Estimated 43 (L) >60 mL/min   Anion gap 9 5 - 15  Urinalysis, Routine w reflex microscopic Urine, Clean Catch   Collection Time: 11/23/20  9:38 AM  Result Value Ref Range   Color, Urine YELLOW YELLOW   APPearance  TURBID (A) CLEAR   Specific Gravity, Urine 1.013 1.005 - 1.030   pH 6.0 5.0 - 8.0  Glucose, UA NEGATIVE NEGATIVE mg/dL   Hgb urine dipstick MODERATE (A) NEGATIVE   Bilirubin Urine NEGATIVE NEGATIVE   Ketones, ur NEGATIVE NEGATIVE mg/dL   Protein, ur >=656 (A) NEGATIVE mg/dL   Nitrite NEGATIVE NEGATIVE   Leukocytes,Ua LARGE (A) NEGATIVE   RBC / HPF >50 (H) 0 - 5 RBC/hpf   WBC, UA >50 (H) 0 - 5 WBC/hpf   Bacteria, UA FEW (A) NONE SEEN   Squamous Epithelial / LPF 21-50 0 - 5   WBC Clumps PRESENT   Protein / creatinine ratio, urine   Collection Time: 11/23/20  9:38 AM  Result Value Ref Range   Creatinine, Urine 113.26 mg/dL   Total Protein, Urine 432 mg/dL   Protein Creatinine Ratio 3.81 (H) 0.00 - 0.15 mg/mg[Cre]  Rapid urine drug screen (hospital performed)   Collection Time: 11/23/20  9:38 AM  Result Value Ref Range   Opiates POSITIVE (A) NONE DETECTED   Cocaine NONE DETECTED NONE DETECTED   Benzodiazepines NONE DETECTED NONE DETECTED   Amphetamines POSITIVE (A) NONE DETECTED   Tetrahydrocannabinol POSITIVE (A) NONE DETECTED   Barbiturates NONE DETECTED NONE DETECTED  ABO/Rh   Collection Time: 11/23/20  9:50 AM  Result Value Ref Range   ABO/RH(D) O POS   Type and screen [facility]   Collection Time: 11/23/20  9:50 AM  Result Value Ref Range   ABO/RH(D) O POS    Antibody Screen NEG    Sample Expiration      11/26/2020,2359 Performed at [facility], [address], [place], [place] [address]   Wet prep, genital   Collection Time: 11/23/20 10:00 AM   Specimen: Vaginal/Rectal  Result Value Ref Range   Yeast Wet Prep HPF POC NONE SEEN NONE SEEN   Trich, Wet Prep PRESENT (A) NONE SEEN   Clue Cells Wet Prep HPF POC NONE SEEN NONE SEEN   WBC, Wet Prep HPF POC MANY (A) NONE SEEN   Sperm NONE SEEN   HIV Antibody (routine testing w rflx)   Collection Time: 11/23/20 10:15 AM  Result Value Ref Range   HIV Screen 4th Generation wRfx Non Reactive Non  Reactive  Hepatitis B surface antigen   Collection Time: 11/23/20 10:15 AM  Result Value Ref Range   Hepatitis B Surface Ag NON REACTIVE NON REACTIVE  Hemoglobin A1c   Collection Time: 11/23/20 10:15 AM  Result Value Ref Range   Hgb A1c MFr Bld 5.7 (H) 4.8 - 5.6 %   Mean Plasma Glucose 116.89 mg/dL  Hepatitis C antibody   Collection Time: 11/23/20 10:15 AM  Result Value Ref Range   HCV Ab NON REACTIVE NON REACTIVE  Resp Panel by RT-PCR (Flu A&B, Covid) Nasopharyngeal Swab   Collection Time: 11/23/20 11:12 AM   Specimen: Nasopharyngeal Swab; Nasopharyngeal(NP) swabs in vial transport medium  Result Value Ref Range   SARS Coronavirus 2 by RT PCR NEGATIVE NEGATIVE   Influenza A by PCR NEGATIVE NEGATIVE   Influenza B by PCR NEGATIVE NEGATIVE    Patient Active Problem List   Diagnosis Date Noted  . Severe preeclampsia, third trimester 11/23/2020  . Indication for care in labor or delivery 01/27/2015  . NSVD (normal spontaneous vaginal delivery) 01/27/2015    Assessment/Plan:  LYNNETT LANGLINAIS is a 30 y.o. [id number] at [redacted]w[redacted]d here for IOL for preeclampsia with severe features  #Labor: Discussed labor IOL options. Patient declines FB and desires pitocin. Wishes to get epidural first.   Patient difficult to arouse  upon assessment. Patient reports last substance use was yesterday but has had significant change in behavior since arrival to MAU. Will continue to monitor  Maternal HR having periods of bradycardia. EKG normal. Will consider telemetry if becomes persistent.   Patient denies HA, visual changes or epigastric pain. Reports she always has high blood pressure in pregnancy. Significant lower extremity edema. Magnesium running and BPs no longer severe range. Will watch labs closely.   Trichomoniasis treated in MAU. Other STD labs pending. Patient denies known exposure.  #Pain: epidural #FWB: Cat 1 #ID:  GBS pending, PCN due to preterm status #MOF: Bottle #MOC: planning pills.  Discussed LARC options at length. Will discuss again when patient is more alert.  Rolm Bookbinder, CNM  11/23/2020, 2:19 PM

## 2020-11-23 NOTE — Anesthesia Preprocedure Evaluation (Signed)
Anesthesia Evaluation  Patient identified by MRN, date of birth, ID band Patient awake    Reviewed: Allergy & Precautions, Patient's Chart, lab work & pertinent test results  Airway Mallampati: II  TM Distance: >3 FB     Dental   Pulmonary Current Smoker,    breath sounds clear to auscultation       Cardiovascular hypertension (pre-E),  Rhythm:Regular Rate:Bradycardia     Neuro/Psych negative neurological ROS     GI/Hepatic negative GI ROS, (+)     substance abuse (Last meth use yesterday. Denies drug use today. UDS positive for THC, amphetamine and opioids.)  marijuana use and methamphetamine use,   Endo/Other  negative endocrine ROS  Renal/GU Renal InsufficiencyRenal disease     Musculoskeletal  (+) narcotic dependent  Abdominal   Peds  Hematology  (+) anemia ,   Anesthesia Other Findings   Reproductive/Obstetrics (+) Pregnancy                             Anesthesia Physical Anesthesia Plan  ASA: III  Anesthesia Plan: Epidural   Post-op Pain Management:    Induction:   PONV Risk Score and Plan: Treatment may vary due to age or medical condition  Airway Management Planned: Natural Airway  Additional Equipment:   Intra-op Plan:   Post-operative Plan:   Informed Consent: I have reviewed the patients History and Physical, chart, labs and discussed the procedure including the risks, benefits and alternatives for the proposed anesthesia with the patient or authorized representative who has indicated his/her understanding and acceptance.       Plan Discussed with:   Anesthesia Plan Comments:         Anesthesia Quick Evaluation

## 2020-11-23 NOTE — Progress Notes (Signed)
Midwife and anesthesia notified of bradycardia while resting. Pt. Denies opiate use today. Pt. Somnolent, but easily aroused. Pt. Given beta blockers prior to admission for severe range BP readings.

## 2020-11-24 ENCOUNTER — Encounter (HOSPITAL_COMMUNITY): Payer: Self-pay | Admitting: Obstetrics & Gynecology

## 2020-11-24 DIAGNOSIS — Z3A35 35 weeks gestation of pregnancy: Secondary | ICD-10-CM

## 2020-11-24 DIAGNOSIS — F1990 Other psychoactive substance use, unspecified, uncomplicated: Secondary | ICD-10-CM

## 2020-11-24 DIAGNOSIS — O326XX0 Maternal care for compound presentation, not applicable or unspecified: Secondary | ICD-10-CM

## 2020-11-24 DIAGNOSIS — O99324 Drug use complicating childbirth: Secondary | ICD-10-CM

## 2020-11-24 DIAGNOSIS — O1414 Severe pre-eclampsia complicating childbirth: Secondary | ICD-10-CM

## 2020-11-24 LAB — COMPREHENSIVE METABOLIC PANEL
ALT: 12 U/L (ref 0–44)
ALT: 15 U/L (ref 0–44)
AST: 26 U/L (ref 15–41)
AST: 31 U/L (ref 15–41)
Albumin: 1.6 g/dL — ABNORMAL LOW (ref 3.5–5.0)
Albumin: 1.6 g/dL — ABNORMAL LOW (ref 3.5–5.0)
Alkaline Phosphatase: 234 U/L — ABNORMAL HIGH (ref 38–126)
Alkaline Phosphatase: 211 U/L — ABNORMAL HIGH (ref 38–126)
Anion gap: 8 (ref 5–15)
Anion gap: 10 (ref 5–15)
BUN: 15 mg/dL (ref 6–20)
BUN: 15 mg/dL (ref 6–20)
CO2: 21 mmol/L — ABNORMAL LOW (ref 22–32)
CO2: 23 mmol/L (ref 22–32)
Calcium: 7.4 mg/dL — ABNORMAL LOW (ref 8.9–10.3)
Calcium: 7.2 mg/dL — ABNORMAL LOW (ref 8.9–10.3)
Chloride: 104 mmol/L (ref 98–111)
Chloride: 100 mmol/L (ref 98–111)
Creatinine, Ser: 1.36 mg/dL — ABNORMAL HIGH (ref 0.44–1.00)
Creatinine, Ser: 1.63 mg/dL — ABNORMAL HIGH (ref 0.44–1.00)
GFR, Estimated: 54 mL/min — ABNORMAL LOW (ref >60)
GFR, Estimated: 43 mL/min — ABNORMAL LOW (ref >60)
Glucose, Bld: 87 mg/dL (ref 70–99)
Glucose, Bld: 166 mg/dL — ABNORMAL HIGH (ref 70–99)
Potassium: 4.4 mmol/L (ref 3.5–5.1)
Potassium: 4.5 mmol/L (ref 3.5–5.1)
Sodium: 133 mmol/L — ABNORMAL LOW (ref 135–145)
Sodium: 133 mmol/L — ABNORMAL LOW (ref 135–145)
Total Bilirubin: 0.8 mg/dL (ref 0.3–1.2)
Total Bilirubin: 0.5 mg/dL (ref 0.3–1.2)
Total Protein: 5.3 g/dL — ABNORMAL LOW (ref 6.5–8.1)
Total Protein: 5.6 g/dL — ABNORMAL LOW (ref 6.5–8.1)

## 2020-11-24 LAB — MAGNESIUM
Magnesium: 7.2 mg/dL (ref 1.7–2.4)
Magnesium: 7.6 mg/dL (ref 1.7–2.4)

## 2020-11-24 LAB — RPR: RPR Ser Ql: NONREACTIVE

## 2020-11-24 MED ORDER — WITCH HAZEL-GLYCERIN EX PADS: 1.0000 "application " | MEDICATED_PAD | CUTANEOUS | Status: DC | PRN

## 2020-11-24 MED ORDER — METHYLERGONOVINE MALEATE 0.2 MG/ML IJ SOLN
INTRAMUSCULAR | Status: AC
  Administered 2020-11-24: 0.2 mg via INTRAMUSCULAR
  Filled 2020-11-24: qty 1

## 2020-11-24 MED ORDER — ACETAMINOPHEN 325 MG PO TABS
650.0000 mg | ORAL_TABLET | ORAL | Status: DC | PRN
  Administered 2020-11-25 (×2): 650 mg via ORAL
  Filled 2020-11-24 (×2): qty 2

## 2020-11-24 MED ORDER — PRENATAL MULTIVITAMIN CH
1.0000 | ORAL_TABLET | Freq: Every day | ORAL | Status: DC
  Administered 2020-11-25: 1 via ORAL
  Filled 2020-11-24: qty 1

## 2020-11-24 MED ORDER — COCONUT OIL OIL: 1.0000 "application " | TOPICAL_OIL | Status: DC | PRN

## 2020-11-24 MED ORDER — MAGNESIUM SULFATE 40 GM/1000ML IV SOLN: 1.0000 g/h | INTRAVENOUS | Status: DC

## 2020-11-24 MED ORDER — SENNOSIDES-DOCUSATE SODIUM 8.6-50 MG PO TABS
2.0000 | ORAL_TABLET | ORAL | Status: DC
  Administered 2020-11-24 – 2020-11-26 (×3): 2 via ORAL
  Filled 2020-11-24 (×3): qty 2

## 2020-11-24 MED ORDER — DIPHENHYDRAMINE HCL 25 MG PO CAPS: 25.0000 mg | ORAL_CAPSULE | Freq: Four times a day (QID) | ORAL | Status: DC | PRN

## 2020-11-24 MED ORDER — SIMETHICONE 80 MG PO CHEW: 80.0000 mg | CHEWABLE_TABLET | ORAL | Status: DC | PRN

## 2020-11-24 MED ORDER — ONDANSETRON HCL 4 MG/2ML IJ SOLN
4.0000 mg | INTRAMUSCULAR | Status: DC | PRN
  Administered 2020-11-25: 4 mg via INTRAVENOUS
  Filled 2020-11-24: qty 2

## 2020-11-24 MED ORDER — TRANEXAMIC ACID-NACL 1000-0.7 MG/100ML-% IV SOLN
INTRAVENOUS | Status: AC
  Administered 2020-11-24: 1000 mg
  Filled 2020-11-24: qty 100

## 2020-11-24 MED ORDER — DIBUCAINE (PERIANAL) 1 % EX OINT: 1.0000 "application " | TOPICAL_OINTMENT | CUTANEOUS | Status: DC | PRN

## 2020-11-24 MED ORDER — BENZOCAINE-MENTHOL 20-0.5 % EX AERO: 1.0000 "application " | INHALATION_SPRAY | CUTANEOUS | Status: DC | PRN

## 2020-11-24 MED ORDER — IBUPROFEN 600 MG PO TABS
600.0000 mg | ORAL_TABLET | Freq: Four times a day (QID) | ORAL | Status: DC
  Administered 2020-11-24 – 2020-11-26 (×9): 600 mg via ORAL
  Filled 2020-11-24 (×9): qty 1

## 2020-11-24 MED ORDER — MEASLES, MUMPS & RUBELLA VAC IJ SOLR: 0.5000 mL | Freq: Once | INTRAMUSCULAR | Status: DC

## 2020-11-24 MED ORDER — TETANUS-DIPHTH-ACELL PERTUSSIS 5-2.5-18.5 LF-MCG/0.5 IM SUSY: 0.5000 mL | PREFILLED_SYRINGE | Freq: Once | INTRAMUSCULAR | Status: DC

## 2020-11-24 MED ORDER — LACTATED RINGERS IV SOLN: INTRAVENOUS | Status: DC

## 2020-11-24 MED ORDER — METHYLERGONOVINE MALEATE 0.2 MG/ML IJ SOLN: 0.2000 mg | Freq: Once | INTRAMUSCULAR | Status: AC

## 2020-11-24 MED ORDER — ONDANSETRON HCL 4 MG PO TABS: 4.0000 mg | ORAL_TABLET | ORAL | Status: DC | PRN

## 2020-11-24 NOTE — Discharge Instructions (Signed)

## 2020-11-24 NOTE — Progress Notes (Signed)
Labor Progress Note [name] is a 30 y.o. [id number] at [redacted]w[redacted]d presented for IOL-preE w/SF. S: Sleeping, difficult to arouse.  O:  BP (!) 96/44   Pulse (!) 52   Temp 97.8 F (36.6 C) (Axillary)   Resp 16   Ht 5\' 5"  (1.651 m)   Wt 89.4 kg   LMP  (LMP Unknown)   SpO2 100%   BMI 32.78 kg/m  EFM: baseline 110bpm/min-mod variability/no accels/no decels Toco: q1-8 min  CVE: Dilation: 10 Dilation Complete Date: 11/24/20 Dilation Complete Time: 0026 Effacement (%): 100 Cervical Position: Posterior Station: -2 Presentation: Vertex Exam by:: [email or web address], MD   A&P: 30 y.o. 26 [redacted]w[redacted]d presented for IOL-preE w/SF. #IOL: S/p FB. AROM @1730 . Patient with minimal variability, discussed case with Dr. [redacted]w[redacted]d prior to starting pitocin but given patient only 5cm and not contracting will need to augment. Will start pitocin at this time. #Pain: PRN #FWB: cat 1, intermittently cat 2 with minimal variability #GBS culture pending, PCN given preterm #Polysubstance abuse: UDS on admit positive for amphetamines, THC, opiates. Concern that patient was alert and oriented in MAU and then somnolent on presentation to L&D and that she may have used since admission, patient endorses last use yesterday. Given ativan 2mg  previously by CNM for withdrawal. Patient wakes with sternal rub. #No PNC: SW postpartum, prenatal labs pending. #preE w/ SF: Cr 1.6 and P/C 3.81 on admit. BP initially severe range, now normotensive. Asymptomatic. Continue to monitor. Mg, anti-htn. #Trichomonas in pregnancy: + on admit to L&D, treated with flagyl.   , MD 1:24 AM

## 2020-11-24 NOTE — Anesthesia Postprocedure Evaluation (Signed)
Anesthesia Post Note  Patient: [name]  Procedure(s) Performed: AN AD HOC LABOR EPIDURAL     Patient location during evaluation: Mother Baby Anesthesia Type: Epidural Level of consciousness: awake and alert, oriented and patient cooperative Pain management: pain level controlled Vital Signs Assessment: post-procedure vital signs reviewed and stable Respiratory status: spontaneous breathing Cardiovascular status: stable Postop Assessment: no headache, epidural receding, patient able to bend at knees and no signs of nausea or vomiting Anesthetic complications: no Comments: Pt. States she is walking.  Pain score 0.    No complications documented.  Last Vitals:  Vitals:   11/24/20 1110 11/24/20 1159  BP:  (!) 152/100  Pulse:    Resp: 18 18  Temp:    SpO2:  (!) 49%    Last Pain:  Vitals:   11/24/20 1159  TempSrc:   PainSc: 0-No pain   Pain Goal:                   [facility]

## 2020-11-24 NOTE — Discharge Summary (Signed)
   Postpartum Discharge Summary    Patient Name: [name] DOB: 08/13/1990 MRN: [id number]  Date of admission: 11/23/2020 Delivery date:11/24/2020  Delivering provider: FIRESTONE, ALICIA C  Date of discharge: 11/26/2020  Admitting diagnosis: Severe preeclampsia, third trimester [O14.13] Intrauterine pregnancy: [redacted]w[redacted]d     Secondary diagnosis:  Active Problems:   Indication for care in labor or delivery   Severe preeclampsia, third trimester   Polysubstance abuse (HCC)   Tobacco use   No prenatal care in current pregnancy   AKI (acute kidney injury) (HCC)   Trichomonal vaginitis during pregnancy Chlamydia    Additional problems: none    Discharge diagnosis: Preterm Pregnancy Delivered and Preeclampsia (severe)                   Polysubstance abuse (HCC)   Tobacco use   No prenatal care in current pregnancy   AKI (acute kidney injury) (HCC)   Trichomonal vaginitis during pregnancy Chlamydia Post partum procedures:none Augmentation: AROM, Pitocin and IP Foley Complications: None  Hospital course: Induction of Labor With Vaginal Delivery   29 y.o. yo G8P3134 at [redacted]w[redacted]d was admitted to the hospital 11/23/2020 for induction of labor.  Indication for induction: Preeclampsia.  Patient had an uncomplicated labor course as follows: Membrane Rupture Time/Date: 5:40 PM ,11/23/2020   Delivery Method:Vaginal, Spontaneous  Episiotomy: None  Lacerations:  None  Details of delivery can be found in separate delivery note.  Due to preeclampsia- pt received 24hr IV Magnesium following delivery.  BP was controlled with Procardia XL 60mg twice daily.  She was also diagnosed with Chlamydia and treated prior to discharge.  Patient had a routine postpartum course. Patient is discharged home 11/26/20.  Newborn Data: Birth date:11/24/2020  Birth time:3:50 AM  Gender:Female  Living status:Living  Apgars:8 ,9  Weight:2840 g   Magnesium Sulfate received: Yes: Seizure prophylaxis BMZ received:  Yes Rhophylac:N/A MMR: offered to patient T-DaP: offered to patient Flu: N/A Transfusion:No  Physical exam  Vitals:   11/25/20 2307 11/25/20 2310 11/26/20 0543 11/26/20 0922  BP: (!) 159/105 (!) 145/93 131/74 132/69  Pulse: 71 61 94 99  Resp:   16 18  Temp:   98.5 F (36.9 C) 98.1 F (36.7 C)  TempSrc:   Oral Oral  SpO2:    98%  Weight:      Height:       General: alert, cooperative and no distress Lochia: appropriate Uterine Fundus: firm Incision: N/A DVT Evaluation: No evidence of DVT seen on physical exam. Labs: Lab Results  Component Value Date   WBC 15.8 (H) 11/26/2020   HGB 8.4 (L) 11/26/2020   HCT 25.8 (L) 11/26/2020   MCV 82.4 11/26/2020   PLT 262 11/26/2020   CMP Latest Ref Rng & Units 11/26/2020  Glucose 70 - 99 mg/dL 85  BUN 6 - 20 mg/dL 12  Creatinine 0.44 - 1.00 mg/dL 1.19(H)  Sodium 135 - 145 mmol/L 137  Potassium 3.5 - 5.1 mmol/L 4.3  Chloride 98 - 111 mmol/L 104  CO2 22 - 32 mmol/L 25  Calcium 8.9 - 10.3 mg/dL 7.6(L)  Total Protein 6.5 - 8.1 g/dL 5.2(L)  Total Bilirubin 0.3 - 1.2 mg/dL 0.3  Alkaline Phos 38 - 126 U/L 163(H)  AST 15 - 41 U/L 27  ALT 0 - 44 U/L 16   Edinburgh Score: Edinburgh Postnatal Depression Scale Screening Tool 11/24/2020  I have been able to laugh and see the funny side of things. (No Data)       After visit meds:  Allergies as of 11/26/2020   No Known Allergies     Medication List    STOP taking these medications   benzonatate 100 MG capsule Commonly known as: TESSALON   oxyCODONE-acetaminophen 5-325 MG tablet Commonly known as: Percocet   predniSONE 20 MG tablet Commonly known as: DELTASONE     TAKE these medications   ibuprofen 600 MG tablet Commonly known as: ADVIL Take 1 tablet (600 mg total) by mouth every 6 (six) hours as needed for mild pain or moderate pain.   NIFEdipine 60 MG 24 hr tablet Commonly known as: ADALAT CC Take 1 tablet (60 mg total) by mouth 2 (two) times daily.   norethindrone  0.35 MG tablet Commonly known as: MICRONOR Take 1 tablet (0.35 mg total) by mouth daily.   prenatal multivitamin Tabs tablet Take 1 tablet by mouth daily at 12 noon.        Discharge home in stable condition Infant Feeding: Bottle Infant Disposition:NICU Discharge instruction: per After Visit Summary and Postpartum booklet. Activity: Advance as tolerated. Pelvic rest for 6 weeks.  Diet: routine diet Future Appointments: Future Appointments  Date Time Provider Department Center  12/04/2020 10:00 AM [facility] NURSE [facility]  01/10/2021 10:15 AM Wenzel, Julie N, PA-C [facility]   Follow up Visit:  Follow-up Information    [facility] Follow up in 1 week(s).   Specialty: Obstetrics and Gynecology Why: Please call to schedule BP check Contact information: [address]  [place] [address] [phone]             Message sent to [facility] 11/24/20 by [employer].   Please schedule this patient for a In person postpartum visit in 6 weeks with the following provider: Any provider. Additional Postpartum F/U:BP check 1 week  High risk pregnancy complicated by: HTN, polysubstance abuse, no prenatal care Delivery mode:  Vaginal, Spontaneous  Anticipated Birth Control:  POPs   11/26/2020 Jennifer M Ozan, DO   

## 2020-11-24 NOTE — Progress Notes (Addendum)
Was approached by staff to report that the patient had a box with drug paraphernalia with her in the bed. Myself and Ree Edman (as witness), spoke with the patient about the need to remove those items from the room as a matter of safety. Patient willingly handed me a small red and white box with the words "A little something, something, ...." marked on the outside. Inside the box was a blue and silver knife, 2 pipes, 3 clear capsules, a gold and clear bottle with a white powder substance, a hairpin, lighter, thumb tack, razor blade (uncovered). I turned over the box and contents to [employer] witnessed by [employer]. Officer Clay placed the contents in a security bag and walked to the [employer] office for processing and reporting. Dr. Jolayne Panther made aware of the situation. I spoke briefly to the patient about utilizing our resources for drug abstinence while inpatient. Patient very compliant, but did not seem interested in obtaining substance abuse help.

## 2020-11-25 LAB — COMPREHENSIVE METABOLIC PANEL
ALT: 14 U/L (ref 0–44)
AST: 26 U/L (ref 15–41)
Albumin: 1.5 g/dL — ABNORMAL LOW (ref 3.5–5.0)
Alkaline Phosphatase: 184 U/L — ABNORMAL HIGH (ref 38–126)
Anion gap: 8 (ref 5–15)
BUN: 15 mg/dL (ref 6–20)
CO2: 23 mmol/L (ref 22–32)
Calcium: 7 mg/dL — ABNORMAL LOW (ref 8.9–10.3)
Chloride: 104 mmol/L (ref 98–111)
Creatinine, Ser: 1.31 mg/dL — ABNORMAL HIGH (ref 0.44–1.00)
GFR, Estimated: 56 mL/min — ABNORMAL LOW (ref >60)
Glucose, Bld: 78 mg/dL (ref 70–99)
Potassium: 4.1 mmol/L (ref 3.5–5.1)
Sodium: 135 mmol/L (ref 135–145)
Total Bilirubin: 0.3 mg/dL (ref 0.3–1.2)
Total Protein: 5.1 g/dL — ABNORMAL LOW (ref 6.5–8.1)

## 2020-11-25 LAB — CULTURE, BETA STREP (GROUP B ONLY)

## 2020-11-25 LAB — CBC
HCT: 29.7 % — ABNORMAL LOW (ref 36.0–46.0)
Hemoglobin: 10.2 g/dL — ABNORMAL LOW (ref 12.0–15.0)
MCH: 26.8 pg (ref 26.0–34.0)
MCHC: 34.3 g/dL (ref 30.0–36.0)
MCV: 78.2 fL — ABNORMAL LOW (ref 80.0–100.0)
Platelets: 313 10*3/uL (ref 150–400)
RBC: 3.8 MIL/uL — ABNORMAL LOW (ref 3.87–5.11)
RDW: 12.8 % (ref 11.5–15.5)
WBC: 23.5 10*3/uL — ABNORMAL HIGH (ref 4.0–10.5)
nRBC: 0 % (ref 0.0–0.2)

## 2020-11-25 LAB — GC/CHLAMYDIA PROBE AMP (~~LOC~~) NOT AT ARMC
Chlamydia: POSITIVE — AB
Comment: NEGATIVE
Comment: NORMAL
Neisseria Gonorrhea: NEGATIVE

## 2020-11-25 LAB — RUBELLA SCREEN: Rubella: 1.28 index (ref >0.99)

## 2020-11-25 LAB — BILE ACIDS, TOTAL: Bile Acids Total: 1.8 umol/L (ref 0.0–10.0)

## 2020-11-25 MED ORDER — CALCIUM CARBONATE ANTACID 500 MG PO CHEW
200.0000 mg | CHEWABLE_TABLET | ORAL | Status: DC | PRN
  Filled 2020-11-25: qty 1

## 2020-11-25 MED ORDER — CALCIUM CARBONATE ANTACID 500 MG PO CHEW
400.0000 mg | CHEWABLE_TABLET | ORAL | Status: DC | PRN
  Administered 2020-11-25: 400 mg via ORAL

## 2020-11-25 MED ORDER — NIFEDIPINE ER OSMOTIC RELEASE 30 MG PO TB24
30.0000 mg | ORAL_TABLET | Freq: Two times a day (BID) | ORAL | Status: DC
  Administered 2020-11-25: 30 mg via ORAL
  Filled 2020-11-25: qty 1

## 2020-11-25 MED ORDER — PANTOPRAZOLE SODIUM 20 MG PO TBEC
20.0000 mg | DELAYED_RELEASE_TABLET | Freq: Every day | ORAL | Status: DC
  Administered 2020-11-25 – 2020-11-26 (×2): 20 mg via ORAL
  Filled 2020-11-25 (×2): qty 1

## 2020-11-25 MED ORDER — NIFEDIPINE ER OSMOTIC RELEASE 60 MG PO TB24
60.0000 mg | ORAL_TABLET | Freq: Two times a day (BID) | ORAL | Status: DC
  Administered 2020-11-25 – 2020-11-26 (×3): 60 mg via ORAL
  Filled 2020-11-25 (×3): qty 1

## 2020-11-25 NOTE — Plan of Care (Signed)
  Problem: Clinical Measurements: Goal: Ability to maintain clinical measurements within normal limits will improve Outcome: Progressing Goal: Respiratory complications will improve Outcome: Progressing   Problem: Education: Goal: Knowledge of condition will improve Outcome: Progressing

## 2020-11-25 NOTE — Progress Notes (Signed)
POSTPARTUM PROGRESS NOTE  PPD #1  Subjective:  [name] is a 30 y.o. [id number] s/p NSVD at [redacted]w[redacted]d. Today she notes no acute complaints. She denies any problems with ambulating, voiding or po intake. Denies nausea or vomiting. She has not yet passed flatus, no BM.  Pain is well controlled.  Lochia moderate Denies fever/chills/chest pain/SOB.  no HA, no blurry vision, no RUQ pain  Objective: Blood pressure (!) 158/93, pulse 76, temperature 98.1 F (36.7 C), temperature source Oral, resp. rate 18, height 5\' 5"  (1.651 m), weight 89.4 kg, SpO2 100 %, unknown if currently breastfeeding.   Vitals:   11/24/20 0547 11/24/20 0624 11/24/20 0721 11/24/20 0926  BP: 125/80 137/74 130/90 136/79   11/24/20 1159 11/24/20 1200 11/24/20 1608 11/24/20 1951  BP: (!) 152/100 (!) 152/100 134/71 (!) 158/95   11/24/20 2320 11/25/20 0634 11/25/20 0755 11/25/20 0817  BP: (!) 152/87 (!) 142/83 (!) 169/108 (!) 158/93     Physical Exam:  General: alert, cooperative and no distress Chest: no respiratory distress, CTAB Heart: regular rate and rhythm Abdomen: soft, nontender Uterine Fundus: firm, below umbilicus DVT Evaluation: No calf swelling or tenderness Extremities: 2+ pitting edema Skin: warm, dry  Results for orders placed or performed during the hospital encounter of 11/23/20 (from the past 24 hour(s))  Magnesium     Status: Abnormal   Collection Time: 11/24/20  1:16 PM  Result Value Ref Range   Magnesium 7.6 (HH) 1.7 - 2.4 mg/dL  Comprehensive metabolic panel     Status: Abnormal   Collection Time: 11/24/20  5:55 PM  Result Value Ref Range   Sodium 133 (L) 135 - 145 mmol/L   Potassium 4.5 3.5 - 5.1 mmol/L   Chloride 100 98 - 111 mmol/L   CO2 23 22 - 32 mmol/L   Glucose, Bld 166 (H) 70 - 99 mg/dL   BUN 15 6 - 20 mg/dL   Creatinine, Ser 11/26/20 (H) 0.44 - 1.00 mg/dL   Calcium 7.2 (L) 8.9 - 10.3 mg/dL   Total Protein 5.6 (L) 6.5 - 8.1 g/dL   Albumin 1.6 (L) 3.5 - 5.0 g/dL   AST 31 15 - 41  U/L   ALT 15 0 - 44 U/L   Alkaline Phosphatase 211 (H) 38 - 126 U/L   Total Bilirubin 0.5 0.3 - 1.2 mg/dL   GFR, Estimated 43 (L) >60 mL/min   Anion gap 10 5 - 15  CBC     Status: Abnormal   Collection Time: 11/25/20  3:14 AM  Result Value Ref Range   WBC 23.5 (H) 4.0 - 10.5 K/uL   RBC 3.80 (L) 3.87 - 5.11 MIL/uL   Hemoglobin 10.2 (L) 12.0 - 15.0 g/dL   HCT 11/27/20 (L) 60.6 - 30.1 %   MCV 78.2 (L) 80.0 - 100.0 fL   MCH 26.8 26.0 - 34.0 pg   MCHC 34.3 30.0 - 36.0 g/dL   RDW 60.1 09.3 - 23.5 %   Platelets 313 150 - 400 K/uL   nRBC 0.0 0.0 - 0.2 %  Comprehensive metabolic panel     Status: Abnormal   Collection Time: 11/25/20  3:14 AM  Result Value Ref Range   Sodium 135 135 - 145 mmol/L   Potassium 4.1 3.5 - 5.1 mmol/L   Chloride 104 98 - 111 mmol/L   CO2 23 22 - 32 mmol/L   Glucose, Bld 78 70 - 99 mg/dL   BUN 15 6 - 20 mg/dL   Creatinine, Ser  1.31 (H) 0.44 - 1.00 mg/dL   Calcium 7.0 (L) 8.9 - 10.3 mg/dL   Total Protein 5.1 (L) 6.5 - 8.1 g/dL   Albumin 1.5 (L) 3.5 - 5.0 g/dL   AST 26 15 - 41 U/L   ALT 14 0 - 44 U/L   Alkaline Phosphatase 184 (H) 38 - 126 U/L   Total Bilirubin 0.3 0.3 - 1.2 mg/dL   GFR, Estimated 56 (L) >60 mL/min   Anion gap 8 5 - 15    Assessment/Plan: [name] is a 30 y.o. [phone] s/p SVD at [redacted]w[redacted]d PPD#1 complicated by: 1) Preeclampsia with severe features, AKI -s/p Magnesium x 24hr -BP elevated, started on Procardia XL 30mg  bid, will continue to closely monitor today -labs as above, Cr improving, UOP adequate, will continue to monitor  2) Polysubstance abuse -declined suboxone as outpatient -social work consulted  Contraception: undecided Feeding: in NICU  Dispo: Continue postpartum care as outlined above   LOS: 2 days   , DO Attending Obstetrician & Gynecologist, Myna Hidalgo for Biochemist, clinical, [facility]

## 2020-11-25 NOTE — Clinical Social Work Maternal (Addendum)
CLINICAL SOCIAL WORK MATERNAL/CHILD NOTE  Patient Details  Name: [name] MRN: [id number] Date of Birth: 04/01/1991  Date:  11/25/2020  Clinical Social Worker Initiating Note:  Sandip Power, LCSW Date/Time: Initiated:  11/25/20/1228     Child's Name:  Unamed at this time   Biological Parents:  Mother   Need for Interpreter:  None   Reason for Referral:  Behavioral Health Concerns,Late or No Prenatal Care ,Current Substance Use/Substance Use During Pregnancy ,Other (Comment) (Infant's NICU admission)   Address:  [address] [place] [address]    Phone number:  [phone] (home)     Additional phone number: [phone]  Household Members/Support Persons (HM/SP):   Household Member/Support Person 1   HM/SP Name Relationship DOB or Age  HM/SP -1   friend    HM/SP -2        HM/SP -3        HM/SP -4        HM/SP -5        HM/SP -6        HM/SP -7        HM/SP -8          Natural Supports (not living in the home):      Professional Supports: None   Employment: Unemployed   Type of Work:     Education:  9 to 11 years   Homebound arranged: No  Financial Resources:  Self-Pay    Other Resources:      Cultural/Religious Considerations Which May Impact Care:    Strengths:  Understanding of illness   Psychotropic Medications:         Pediatrician:       Pediatrician List:       [place]    [place]    [place]    [place]    [place]      Pediatrician Fax Number:    Risk Factors/Current Problems:  Substance Use ,Mental Health Concerns ,Transportation    Cognitive State:  Able to Concentrate ,Alert ,Goal Oriented ,Linear Thinking    Mood/Affect:  Calm ,Interested ,Comfortable ,Relaxed    CSW Assessment: CSW met with [name] at bedside to discuss behavioral health concerns, substance use during pregnancy, no prenatal care and infant's NICU admission. CSW introduced self and explained  reason for consult. [name] was welcoming, open, pleasant, and remained engaged during assessment. [name] reported that she is unemployed and resides with her friend. [name] reported that she is interested in WIC and food stamps. CSW agreed to make a WIC referral and send [name] the link to apply for food stamps, [name] agreeable. [name] reported that she does not have any items for infant. CSW informed [name] about [facility] [employer] if assistance is needed obtaining items for infant. [name] reported that a referral for items would be helpful, CSW agreed to make referral. CSW inquired about [name]'s support system, [name] reported having no supports. [name] reported that her four older children (Izaha Garcia 05/14/05; Alajandro Garcia 09/09/08; Bianka Wormely 08/15/10; Nydia Wormely 01/27/15) reside with [name] the father of (Bianka and Nydia). [name] reported that CPS was not involved in this living arrangement and reported that she has not been in contact with her children in the past 4-5 years. [name] reported that [name] will not let her have contact with the children. [name] spoke about how this was an issue for her. CSW acknowledged and validated how [name] not being in contact with her children   is an issue.  CSW inquired about [name]'s mental health history. [name] reported that she was diagnosed with anxiety and depression more than 5 years ago. [name] reported that her depression and anxiety are currently at it's worse because she has not seen her children. [name] described her depression as crying all the time, noting her depression comes and goes. CSW inquired about [name]'s coping skills, [name] reported none. [name] reported that she is not currently taking any medication nor participating in therapy to treat her anxiety and depression. [name] reported that she last participated in therapy in 2018 when she was in the [facility] program. [name] reported that it was helpful and she graduated the program. CSW inquired about [name]'s interest in  therapy resources, [name] reported that she is interested. CSW agreed to provide therapy resources. [name] reported that she was also interested in medication to treat her anxiety and depression. CSW encouraged [name] to speak with her provider at the hospital about her medication needs/options, [name] agreed. [name] denied any history of postpartum depression. CSW inquired about how [name] was feeling emotionally after giving birth, [name] reported that she was feeling good beside having to have infant alone. [name] shared that she has always had someone present when she has gave birth in the past. CSW acknowledged and validated [name]'s feelings surrounding her experience. [name] presented calm and did not demonstrate any acute mental health signs/symptoms. CSW assessed for safety, [name] denied SI, HI and domestic violence.   CSW provided education regarding the baby blues period vs. perinatal mood disorders, discussed treatment and gave resources for mental health follow up if concerns arise.  CSW recommends self-evaluation during the postpartum time period using the New Mom Checklist from Postpartum Progress and encouraged [name] to contact a medical professional if symptoms are noted at any time.    CSW did not provide a review of Sudden Infant Death Syndrome (SIDS) precautions. SIDS education will be provided prior to discharge.   CSW and [name] discuss infant's NICU admission. CSW informed [name] about the NICU, what to expect and resources/supports available while infant is admitted to the NICU. [name] reported that she feels she has a good understanding of infant's care. [name] reported that she has issues with transportation but utilizes the city bus when she is unable to get a ride from people. CSW informed [name] to let CSW if transportation becomes an issue with visiting infant in the NICU, [name] agreed. [name] reported that meal vouchers would be helpful, CSW agreed to place meal vouchers at infant's bedside. [name] denied any questions/concerns  regarding the NICU.   CSW informed [name] about the hospital drug screen policy due to no prenatal care and substance use during pregnancy. [name] reported that she found out about her pregnancy 3 months ago and was unable to get a ride to receive prenatal care. [name] denied any barriers with getting infant to the pediatrician, noting she will ride the bus if she needs to in order to get infant to appointments. 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 [name] reported that she smokes methamphetamines a couple times a week and smokes weed daily for her sanity. [name] reported that she did not see her substance use an issue because she doesn't do it daily and doesn't withdraw "too bad". [name] declined substance use treatment resources and reported that she will put herself in something if needed. [name] reported that she participated in the Step by Step program from 2017 to 2018 and it helped her to   stay clean for years. [name] reported that she graduated from the program and her Medicaid ran out. [name] reported that she was clean for years and started doing methamphetamines a year ago. CSW informed [name] that infant's UDS was positive for amphetamines and that a CPS report would be made. [name] verbalized understanding and reported that she had CPS history in 2012 and 2016 when her daughters tested positive for THC. [name] denied any questions.   CSW will continue to offer resources/supports while infant is admitted to the NICU.   CSW made a [place] [facility] CPS report for infant's positive UDS for amphetamines. CSW awaiting return call from [facility] CPS.   CSW made WIC referral and sent [name] the link to apply for food stamps.  CSW placed therapy resources and four meal vouchers at infant's bedside.   Currently there are barriers to infant discharging home to [name].    CSW Plan/Description:   Perinatal Mood and Anxiety Disorder (PMADs) Education,Hospital Drug Screen Policy Information,Child Protective Service  Report ,CSW Awaiting CPS Disposition Plan,Other Information/Referral to Community Resources,Other Patient/Family Education,Psychosocial Support and Ongoing Assessment of Needs   Haasini Patnaude L Jamine [place], LCSW 11/25/2020, 12:41 PM 

## 2020-11-26 ENCOUNTER — Other Ambulatory Visit (HOSPITAL_COMMUNITY): Payer: Self-pay

## 2020-11-26 LAB — CULTURE, OB URINE: Culture: >=100000 — AB

## 2020-11-26 LAB — COMPREHENSIVE METABOLIC PANEL
ALT: 16 U/L (ref 0–44)
AST: 27 U/L (ref 15–41)
Albumin: 1.7 g/dL — ABNORMAL LOW (ref 3.5–5.0)
Alkaline Phosphatase: 163 U/L — ABNORMAL HIGH (ref 38–126)
Anion gap: 8 (ref 5–15)
BUN: 12 mg/dL (ref 6–20)
CO2: 25 mmol/L (ref 22–32)
Calcium: 7.6 mg/dL — ABNORMAL LOW (ref 8.9–10.3)
Chloride: 104 mmol/L (ref 98–111)
Creatinine, Ser: 1.19 mg/dL — ABNORMAL HIGH (ref 0.44–1.00)
GFR, Estimated: >60 mL/min (ref >60)
Glucose, Bld: 85 mg/dL (ref 70–99)
Potassium: 4.3 mmol/L (ref 3.5–5.1)
Sodium: 137 mmol/L (ref 135–145)
Total Bilirubin: 0.3 mg/dL (ref 0.3–1.2)
Total Protein: 5.2 g/dL — ABNORMAL LOW (ref 6.5–8.1)

## 2020-11-26 LAB — CBC
HCT: 25.8 % — ABNORMAL LOW (ref 36.0–46.0)
Hemoglobin: 8.4 g/dL — ABNORMAL LOW (ref 12.0–15.0)
MCH: 26.8 pg (ref 26.0–34.0)
MCHC: 32.6 g/dL (ref 30.0–36.0)
MCV: 82.4 fL (ref 80.0–100.0)
Platelets: 262 10*3/uL (ref 150–400)
RBC: 3.13 MIL/uL — ABNORMAL LOW (ref 3.87–5.11)
RDW: 13.3 % (ref 11.5–15.5)
WBC: 15.8 10*3/uL — ABNORMAL HIGH (ref 4.0–10.5)
nRBC: 0.1 % (ref 0.0–0.2)

## 2020-11-26 LAB — SURGICAL PATHOLOGY

## 2020-11-26 MED ORDER — PRENATAL MULTIVITAMIN CH
1.0000 | ORAL_TABLET | Freq: Every day | ORAL | 3 refills | Status: AC
  Filled 2020-11-26: qty 30, 30d supply, fill #0

## 2020-11-26 MED ORDER — AZITHROMYCIN 250 MG PO TABS
1000.0000 mg | ORAL_TABLET | Freq: Once | ORAL | Status: AC
  Administered 2020-11-26: 1000 mg via ORAL
  Filled 2020-11-26: qty 4

## 2020-11-26 MED ORDER — NORETHINDRONE 0.35 MG PO TABS
1.0000 | ORAL_TABLET | Freq: Every day | ORAL | 0 refills | Status: DC
  Filled 2020-11-26: qty 28, 28d supply, fill #0

## 2020-11-26 MED ORDER — IBUPROFEN 600 MG PO TABS
600.0000 mg | ORAL_TABLET | Freq: Four times a day (QID) | ORAL | 0 refills | Status: DC | PRN
  Filled 2020-11-26: qty 30, 8d supply, fill #0

## 2020-11-26 MED ORDER — NIFEDIPINE ER 60 MG PO TB24
60.0000 mg | ORAL_TABLET | Freq: Two times a day (BID) | ORAL | 0 refills | Status: DC
  Filled 2020-11-26: qty 60, 30d supply, fill #0

## 2020-11-26 NOTE — Progress Notes (Signed)
Discharge instructions given to patient. Discussed follow up appointment, medications, postpartum care, signs and symptoms of hypertension, and baby blues. Patient verbalized understanding.   Babyscripts not set up- patient did not have a phone with her to use.

## 2020-11-26 NOTE — Care Management (Signed)
CM received message from CSW and pharmacist that patient has medication for discharge and patient has no insurance and cannot afford medications.  CM qualified for [facility] program and CM filled out [facility] for patient and notified pharmacy team in the [facility] department.  Medications will be delivered to patient prior to discharge. CSW also working with patient.   Gretchen Short RNC-MNN, BSN Transitions of [facility] Pediatrics[facility]

## 2020-11-27 NOTE — Progress Notes (Signed)
Urine culture reviewed. Per [employer] (ID), IV penicillin given in labor is adequate treatment for this bacteria - no additional treatment necessary unless patient becomes symptomatic.   Judeth Horn, NP

## 2020-12-04 ENCOUNTER — Ambulatory Visit: Payer: Self-pay

## 2020-12-04 NOTE — Progress Notes (Deleted)
Pt was no show for appt. Attempted to call pt via phone, no answer, unable to leave VM message. mychart not set up. Will wait for pt to return call.   Judeth Cornfield RN  12/04/20

## 2021-01-10 ENCOUNTER — Ambulatory Visit: Payer: Self-pay | Admitting: Medical

## 2022-07-13 NOTE — L&D Delivery Note (Signed)
OB/GYN Faculty Practice Delivery Note  [name] is a 32 y.o. [id number] s/p SVD at [redacted]w[redacted]d. She was admitted for PTL.   ROM: rupture date, rupture time, delivery date, or delivery time have not been documented with clear fluid GBS Status: unknown   she unfortunately did not receive adequate GBS ppx prior to precipitous delivery  Maximum Maternal Temperature: none taken prior to delivery  Labor Progress: Initial SVE: 6.5/70/0. She then progressed to complete.   Delivery Date/Time: 1307 06/22/23 Delivery: Called to room and patient as patient just arrived to Labor and Delivery Unit from [facility].  Pt screaming out in pain requesting epidural.  While at bedside pt states she needs to defecate.  Cervical check complete with bulging bag of water, BSUS showing cephalic position.  Minutes later she SROMed with clear fluid and stated she needed to push.  Pt started pushing and immediately could see scalp at the introitus. Head delivered LOA. No nuchal cord present. Shoulder and body delivered in usual fashion. Infant with spontaneous cry, placed on mother's abdomen, dried and stimulated. Cord clamped x 2 after 1-minute delay, and cut by Clinical research associate. Cord blood drawn. Placenta delivered spontaneously with gentle cord traction. Fundus firm with massage and Pitocin. Labia, perineum, vagina, and cervix inspected without laceration.  Pt then was noted to have bleeding thought to be secondary to atony so was given Methergine IM x1, rectal cytotec 1g, and TXA.  Lower uterine sweep after IV pain medication with small clot delivery.  Fundus then found to be firm w/o vaginal bleeding.  Mom and baby doing well.   Baby Weight: pending  Placenta: 3 vessel, intact. Sent to pathology Complications: None Lacerations: None EBL: 347 mL Analgesia: None   Infant:  APGAR (1 MIN): 9  APGAR (5 MINS): 9   Hessie Dibble, MD [facility] Family Medicine Fellow, [facility] for [facility], [facility] 06/22/2023, 1:35 PM

## 2023-06-14 ENCOUNTER — Inpatient Hospital Stay (HOSPITAL_BASED_OUTPATIENT_CLINIC_OR_DEPARTMENT_OTHER)

## 2023-06-14 ENCOUNTER — Encounter (HOSPITAL_COMMUNITY): Payer: Self-pay | Admitting: Obstetrics and Gynecology

## 2023-06-14 ENCOUNTER — Other Ambulatory Visit: Payer: Self-pay

## 2023-06-14 ENCOUNTER — Telehealth: Payer: Self-pay | Admitting: Certified Nurse Midwife

## 2023-06-14 ENCOUNTER — Inpatient Hospital Stay (HOSPITAL_COMMUNITY)
Admission: AD | Admit: 2023-06-14 | Discharge: 2023-06-14 | Disposition: A | Discharge status: Home / Self Care | Attending: Obstetrics and Gynecology | Admitting: Obstetrics and Gynecology

## 2023-06-14 ENCOUNTER — Encounter: Payer: Self-pay | Admitting: Obstetrics and Gynecology

## 2023-06-14 DIAGNOSIS — R102 Pelvic and perineal pain: Secondary | ICD-10-CM | POA: Diagnosis present

## 2023-06-14 DIAGNOSIS — Z3687 Encounter for antenatal screening for uncertain dates: Secondary | ICD-10-CM | POA: Insufficient documentation

## 2023-06-14 DIAGNOSIS — O133 Gestational [pregnancy-induced] hypertension without significant proteinuria, third trimester: Secondary | ICD-10-CM | POA: Diagnosis not present

## 2023-06-14 DIAGNOSIS — O26833 Pregnancy related renal disease, third trimester: Secondary | ICD-10-CM | POA: Diagnosis not present

## 2023-06-14 DIAGNOSIS — R109 Unspecified abdominal pain: Secondary | ICD-10-CM | POA: Insufficient documentation

## 2023-06-14 DIAGNOSIS — Z653 Problems related to other legal circumstances: Secondary | ICD-10-CM | POA: Diagnosis not present

## 2023-06-14 DIAGNOSIS — O99333 Smoking (tobacco) complicating pregnancy, third trimester: Secondary | ICD-10-CM | POA: Diagnosis not present

## 2023-06-14 DIAGNOSIS — Z3A34 34 weeks gestation of pregnancy: Secondary | ICD-10-CM

## 2023-06-14 DIAGNOSIS — O99891 Other specified diseases and conditions complicating pregnancy: Secondary | ICD-10-CM | POA: Insufficient documentation

## 2023-06-14 DIAGNOSIS — O4703 False labor before 37 completed weeks of gestation, third trimester: Secondary | ICD-10-CM | POA: Diagnosis present

## 2023-06-14 DIAGNOSIS — O98813 Other maternal infectious and parasitic diseases complicating pregnancy, third trimester: Secondary | ICD-10-CM | POA: Diagnosis not present

## 2023-06-14 DIAGNOSIS — O99323 Drug use complicating pregnancy, third trimester: Secondary | ICD-10-CM | POA: Insufficient documentation

## 2023-06-14 DIAGNOSIS — O0933 Supervision of pregnancy with insufficient antenatal care, third trimester: Secondary | ICD-10-CM | POA: Diagnosis not present

## 2023-06-14 DIAGNOSIS — O23593 Infection of other part of genital tract in pregnancy, third trimester: Secondary | ICD-10-CM | POA: Diagnosis not present

## 2023-06-14 DIAGNOSIS — F17210 Nicotine dependence, cigarettes, uncomplicated: Secondary | ICD-10-CM | POA: Insufficient documentation

## 2023-06-14 DIAGNOSIS — B9689 Other specified bacterial agents as the cause of diseases classified elsewhere: Secondary | ICD-10-CM | POA: Insufficient documentation

## 2023-06-14 DIAGNOSIS — O98313 Other infections with a predominantly sexual mode of transmission complicating pregnancy, third trimester: Secondary | ICD-10-CM | POA: Diagnosis not present

## 2023-06-14 DIAGNOSIS — F1510 Other stimulant abuse, uncomplicated: Secondary | ICD-10-CM | POA: Diagnosis not present

## 2023-06-14 DIAGNOSIS — B3731 Acute candidiasis of vulva and vagina: Secondary | ICD-10-CM | POA: Insufficient documentation

## 2023-06-14 DIAGNOSIS — O1413 Severe pre-eclampsia, third trimester: Secondary | ICD-10-CM | POA: Insufficient documentation

## 2023-06-14 DIAGNOSIS — N179 Acute kidney failure, unspecified: Secondary | ICD-10-CM | POA: Insufficient documentation

## 2023-06-14 DIAGNOSIS — Z720 Tobacco use: Secondary | ICD-10-CM

## 2023-06-14 DIAGNOSIS — O479 False labor, unspecified: Secondary | ICD-10-CM

## 2023-06-14 DIAGNOSIS — A5901 Trichomonal vulvovaginitis: Secondary | ICD-10-CM | POA: Diagnosis not present

## 2023-06-14 DIAGNOSIS — O139 Gestational [pregnancy-induced] hypertension without significant proteinuria, unspecified trimester: Secondary | ICD-10-CM | POA: Insufficient documentation

## 2023-06-14 DIAGNOSIS — F1910 Other psychoactive substance abuse, uncomplicated: Secondary | ICD-10-CM

## 2023-06-14 HISTORY — DX: Acute kidney failure, unspecified: N17.9

## 2023-06-14 LAB — PROTEIN / CREATININE RATIO, URINE
Creatinine, Urine: 93 mg/dL
Protein Creatinine Ratio: 0.15 mg/mg{creat} (ref 0.00–0.15)
Total Protein, Urine: 14 mg/dL

## 2023-06-14 LAB — DIFFERENTIAL
Abs Immature Granulocytes: 0.07 10*3/uL (ref 0.00–0.07)
Basophils Absolute: 0 10*3/uL (ref 0.0–0.1)
Basophils Relative: 0 %
Eosinophils Absolute: 0.2 10*3/uL (ref 0.0–0.5)
Eosinophils Relative: 2 %
Immature Granulocytes: 1 %
Lymphocytes Relative: 18 %
Lymphs Abs: 1.7 10*3/uL (ref 0.7–4.0)
Monocytes Absolute: 0.6 10*3/uL (ref 0.1–1.0)
Monocytes Relative: 7 %
Neutro Abs: 6.7 10*3/uL (ref 1.7–7.7)
Neutrophils Relative %: 72 %

## 2023-06-14 LAB — CBC
HCT: 30.3 % — ABNORMAL LOW (ref 36.0–46.0)
Hemoglobin: 9.9 g/dL — ABNORMAL LOW (ref 12.0–15.0)
MCH: 27 pg (ref 26.0–34.0)
MCHC: 32.7 g/dL (ref 30.0–36.0)
MCV: 82.6 fL (ref 80.0–100.0)
Platelets: 277 10*3/uL (ref 150–400)
RBC: 3.67 MIL/uL — ABNORMAL LOW (ref 3.87–5.11)
RDW: 13.2 % (ref 11.5–15.5)
WBC: 9.3 10*3/uL (ref 4.0–10.5)
nRBC: 0 % (ref 0.0–0.2)

## 2023-06-14 LAB — GC/CHLAMYDIA PROBE AMP (~~LOC~~) NOT AT ARMC
Chlamydia: POSITIVE — AB
Comment: NEGATIVE
Comment: NORMAL
Neisseria Gonorrhea: NEGATIVE

## 2023-06-14 LAB — URINALYSIS, ROUTINE W REFLEX MICROSCOPIC
Bacteria, UA: NONE SEEN
Bilirubin Urine: NEGATIVE
Glucose, UA: NEGATIVE mg/dL
Hgb urine dipstick: NEGATIVE
Ketones, ur: NEGATIVE mg/dL
Nitrite: NEGATIVE
Protein, ur: NEGATIVE mg/dL
Specific Gravity, Urine: 1.016 (ref 1.005–1.030)
pH: 7 (ref 5.0–8.0)

## 2023-06-14 LAB — RAPID URINE DRUG SCREEN, HOSP PERFORMED
Amphetamines: POSITIVE — AB
Barbiturates: NOT DETECTED
Benzodiazepines: NOT DETECTED
Cocaine: NOT DETECTED
Opiates: NOT DETECTED
Tetrahydrocannabinol: NOT DETECTED

## 2023-06-14 LAB — COMPREHENSIVE METABOLIC PANEL
ALT: 13 U/L (ref 0–44)
AST: 18 U/L (ref 15–41)
Albumin: 2.4 g/dL — ABNORMAL LOW (ref 3.5–5.0)
Alkaline Phosphatase: 138 U/L — ABNORMAL HIGH (ref 38–126)
Anion gap: 8 (ref 5–15)
BUN: 9 mg/dL (ref 6–20)
CO2: 22 mmol/L (ref 22–32)
Calcium: 8.2 mg/dL — ABNORMAL LOW (ref 8.9–10.3)
Chloride: 106 mmol/L (ref 98–111)
Creatinine, Ser: 0.62 mg/dL (ref 0.44–1.00)
GFR, Estimated: >60 mL/min (ref >60)
Glucose, Bld: 83 mg/dL (ref 70–99)
Potassium: 3.5 mmol/L (ref 3.5–5.1)
Sodium: 136 mmol/L (ref 135–145)
Total Bilirubin: 0.4 mg/dL (ref <1.2)
Total Protein: 5.8 g/dL — ABNORMAL LOW (ref 6.5–8.1)

## 2023-06-14 LAB — RAPID HIV SCREEN (HIV 1/2 AB+AG)
HIV 1/2 Antibodies: NONREACTIVE
HIV-1 P24 Antigen - HIV24: NONREACTIVE

## 2023-06-14 LAB — TYPE AND SCREEN
ABO/RH(D): O POS
Antibody Screen: NEGATIVE

## 2023-06-14 LAB — WET PREP, GENITAL
Sperm: NONE SEEN
WBC, Wet Prep HPF POC: <10 (ref <10)

## 2023-06-14 LAB — RPR: RPR Ser Ql: NONREACTIVE

## 2023-06-14 LAB — HIV ANTIBODY (ROUTINE TESTING W REFLEX): HIV Screen 4th Generation wRfx: NONREACTIVE

## 2023-06-14 LAB — HEPATITIS B SURFACE ANTIGEN: Hepatitis B Surface Ag: NONREACTIVE

## 2023-06-14 NOTE — MAU Provider Note (Signed)
History     CSN: [id number]  Arrival date and time: 06/14/23 0210   Event Date/Time   First Provider Initiated Contact with Patient 06/14/23 0406      Chief Complaint  Patient presents with   Contractions   [name] , a  32 y.o. [id number] at [redacted]w[redacted]d presents to MAU escorted by [employer] with complaints of increased vaginal pressure and cramping. Patient denies prenatal care and reports that she is "due." She reports intermittent lower abdominal pressure and pain, but currently rates pain a 0/10. She denies vaginal bleeding and leaking of fluid. She reports recent Meth and marijuana use but denies any other illicit drug use. She endorses positive fetal movement.          OB History     Gravida  9   Para  5   Term  3   Preterm  2   AB  3   Living  5      SAB  1   IAB  2   Ectopic      Multiple  0   Live Births  5           Past Medical History:  Diagnosis Date   Polysubstance abuse (HCC)     Past Surgical History:  Procedure Laterality Date   DILATION AND CURETTAGE OF UTERUS      Family History  Problem Relation Age of Onset   Other Neg Hx     Social History   Tobacco Use   Smoking status: Every Day    Current packs/day: 0.50    Types: Cigarettes   Smokeless tobacco: Former   Tobacco comments:    smoked cigarettes a few hours ago  Vaping Use   Vaping status: Some Days  Substance Use Topics   Alcohol use: Not Currently    Comment: Weekends.    Drug use: Yes    Types: Marijuana, Methamphetamines    Comment: last used 12/1    Allergies: No Known Allergies  No medications prior to admission.    Review of Systems  Constitutional:  Negative for chills, fatigue and fever.  Eyes:  Negative for pain and visual disturbance.  Respiratory:  Negative for apnea, shortness of breath and wheezing.   Cardiovascular:  Negative for chest pain and palpitations.  Gastrointestinal:  Negative for abdominal pain, constipation, diarrhea, nausea and  vomiting.  Genitourinary:  Positive for pelvic pain and vaginal pain. Negative for difficulty urinating, dysuria, vaginal bleeding and vaginal discharge.  Musculoskeletal:  Negative for back pain.  Neurological:  Negative for seizures, weakness and headaches.  Psychiatric/Behavioral:  Negative for suicidal ideas.    Physical Exam   Blood pressure 133/82, pulse 86, temperature 97.7 F (36.5 C), temperature source Oral, resp. rate 19, height 5\' 5"  (1.651 m), weight 77.9 kg, SpO2 100%, unknown if currently breastfeeding.  Physical Exam Vitals and nursing note reviewed.  Constitutional:      General: She is not in acute distress.    Appearance: Normal appearance.  HENT:     Head: Normocephalic.  Pulmonary:     Effort: Pulmonary effort is normal.  Abdominal:     Palpations: Abdomen is soft.     Comments: Gravid uterus. Fundal Height ~34cm  Musculoskeletal:     Cervical back: Normal range of motion.  Skin:    General: Skin is warm and dry.  Neurological:     Mental Status: She is alert and oriented to person, place, and time.  Psychiatric:  Mood and Affect: Mood normal.    FHT: 125bpm with moderate variability. Accels present no decles noted  Toco: irregular contractions.   MAU Course  Procedures Orders Placed This Encounter  Procedures   Culture, beta strep (group b only)   Wet prep, genital   [place] MFM OB COMP + 14 WK   Hepatitis B surface antigen   Rubella screen   RPR   CBC   Differential   HIV Antibody (routine testing w rflx)   Rapid HIV screen (HIV 1/2 Ab+Ag)   Comprehensive metabolic panel   Protein / creatinine ratio, urine   Rapid urine drug screen (hospital performed)   Urinalysis, Routine w reflex microscopic -Urine, Clean Catch   AMB referral to maternal fetal medicine   Ambulatory referral to Obstetrics / Gynecology   Type and screen [facility]   Discharge patient   Results for orders placed or performed during the hospital  encounter of 06/14/23 (from the past 24 hour(s))  Protein / creatinine ratio, urine     Status: None   Collection Time: 06/14/23  2:57 AM  Result Value Ref Range   Creatinine, Urine 93 mg/dL   Total Protein, Urine 14 mg/dL   Protein Creatinine Ratio 0.15 0.00 - 0.15 mg/mg[Cre]  Rapid urine drug screen (hospital performed)     Status: Abnormal   Collection Time: 06/14/23  2:57 AM  Result Value Ref Range   Opiates NONE DETECTED NONE DETECTED   Cocaine NONE DETECTED NONE DETECTED   Benzodiazepines NONE DETECTED NONE DETECTED   Amphetamines POSITIVE (A) NONE DETECTED   Tetrahydrocannabinol NONE DETECTED NONE DETECTED   Barbiturates NONE DETECTED NONE DETECTED  Urinalysis, Routine w reflex microscopic -Urine, Clean Catch     Status: Abnormal   Collection Time: 06/14/23  2:58 AM  Result Value Ref Range   Color, Urine YELLOW YELLOW   APPearance HAZY (A) CLEAR   Specific Gravity, Urine 1.016 1.005 - 1.030   pH 7.0 5.0 - 8.0   Glucose, UA NEGATIVE NEGATIVE mg/dL   Hgb urine dipstick NEGATIVE NEGATIVE   Bilirubin Urine NEGATIVE NEGATIVE   Ketones, ur NEGATIVE NEGATIVE mg/dL   Protein, ur NEGATIVE NEGATIVE mg/dL   Nitrite NEGATIVE NEGATIVE   Leukocytes,Ua LARGE (A) NEGATIVE   RBC / HPF 0-5 0 - 5 RBC/hpf   WBC, UA 6-10 0 - 5 WBC/hpf   Bacteria, UA NONE SEEN NONE SEEN   Squamous Epithelial / HPF 6-10 0 - 5 /HPF   Mucus PRESENT   Type and screen [facility]     Status: None   Collection Time: 06/14/23  3:11 AM  Result Value Ref Range   ABO/RH(D) O POS    Antibody Screen NEG    Sample Expiration      06/17/2023,2359 Performed at [facility], [address] [place], [place] [address]   CBC     Status: Abnormal   Collection Time: 06/14/23  3:18 AM  Result Value Ref Range   WBC 9.3 4.0 - 10.5 K/uL   RBC 3.67 (L) 3.87 - 5.11 MIL/uL   Hemoglobin 9.9 (L) 12.0 - 15.0 g/dL   HCT 36.6 (L) 44.0 - 34.7 %   MCV 82.6 80.0 - 100.0 fL   MCH 27.0 26.0 - 34.0 pg   MCHC  32.7 30.0 - 36.0 g/dL   RDW 42.5 95.6 - 38.7 %   Platelets 277 150 - 400 K/uL   nRBC 0.0 0.0 - 0.2 %  Differential  Status: None   Collection Time: 06/14/23  3:18 AM  Result Value Ref Range   Neutrophils Relative % 72 %   Neutro Abs 6.7 1.7 - 7.7 K/uL   Lymphocytes Relative 18 %   Lymphs Abs 1.7 0.7 - 4.0 K/uL   Monocytes Relative 7 %   Monocytes Absolute 0.6 0.1 - 1.0 K/uL   Eosinophils Relative 2 %   Eosinophils Absolute 0.2 0.0 - 0.5 K/uL   Basophils Relative 0 %   Basophils Absolute 0.0 0.0 - 0.1 K/uL   Immature Granulocytes 1 %   Abs Immature Granulocytes 0.07 0.00 - 0.07 K/uL  Rapid HIV screen (HIV 1/2 Ab+Ag)     Status: None   Collection Time: 06/14/23  3:18 AM  Result Value Ref Range   HIV-1 P24 Antigen - HIV24 NON REACTIVE NON REACTIVE   HIV 1/2 Antibodies NON REACTIVE NON REACTIVE   Interpretation (HIV Ag Ab)      A non reactive test result means that HIV 1 or HIV 2 antibodies and HIV 1 p24 antigen were not detected in the specimen.  Comprehensive metabolic panel     Status: Abnormal   Collection Time: 06/14/23  3:18 AM  Result Value Ref Range   Sodium 136 135 - 145 mmol/L   Potassium 3.5 3.5 - 5.1 mmol/L   Chloride 106 98 - 111 mmol/L   CO2 22 22 - 32 mmol/L   Glucose, Bld 83 70 - 99 mg/dL   BUN 9 6 - 20 mg/dL   Creatinine, Ser 1.61 0.44 - 1.00 mg/dL   Calcium 8.2 (L) 8.9 - 10.3 mg/dL   Total Protein 5.8 (L) 6.5 - 8.1 g/dL   Albumin 2.4 (L) 3.5 - 5.0 g/dL   AST 18 15 - 41 U/L   ALT 13 0 - 44 U/L   Alkaline Phosphatase 138 (H) 38 - 126 U/L   Total Bilirubin 0.4 <1.2 mg/dL   GFR, Estimated >09 >60 mL/min   Anion gap 8 5 - 15  Wet prep, genital     Status: Abnormal   Collection Time: 06/14/23  4:58 AM   Specimen: Vaginal/Rectal  Result Value Ref Range   Yeast Wet Prep HPF POC PRESENT (A) NONE SEEN   Trich, Wet Prep PRESENT (A) NONE SEEN   Clue Cells Wet Prep HPF POC PRESENT (A) NONE SEEN   WBC, Wet Prep HPF POC <10 <10   Sperm NONE SEEN    Patient  Vitals for the past 24 hrs:  BP Temp Temp src Pulse Resp SpO2 Height Weight  06/14/23 0445 133/82 -- -- 86 -- 100 % -- --  06/14/23 0430 (!) 144/96 -- -- 79 -- 100 % -- --  06/14/23 0415 129/69 -- -- 72 -- 100 % -- --  06/14/23 0405 (!) 138/95 -- -- 74 -- -- -- --  06/14/23 0315 (!) 135/94 -- -- 73 -- 99 % -- --  06/14/23 0300 129/78 -- -- 79 -- 100 % -- --  06/14/23 0232 (!) 156/91 97.7 F (36.5 C) Oral 76 19 100 % 5\' 5"  (1.651 m) 77.9 kg    MDM - Dilation: Closed Station: 0 Presentation: Vertex Exam by:: Dorathy Daft, CNM Low suspicion for preterm labor.  - Prelim US revealed a single living IUP ~[redacted] weeks gestation in vertex presentation with a lateral placenta. 3VC and ~EFW 5#.  - Given no prenatal care, New OB panel plus OB US complete ordered.  - USD noted Meth positive (which patient admitted  to earlier)  - Elevated BPs in MAU. cHTN versus gHTN?  - plan for discharge.    Assessment and Plan   1. No prenatal care in current pregnancy in third trimester   2. [redacted] weeks gestation of pregnancy   3. False labor   4. Drug use affecting pregnancy in third trimester   5. Abdominal cramping   6. Vaginal delivery   7. Trichomonal vaginitis during pregnancy in third trimester   8. AKI (acute kidney injury) (HCC)   9. Tobacco use   10. Polysubstance abuse (HCC)   11. Severe preeclampsia, third trimester   12. Indication for care in labor or delivery    - Reviewed worsening signs and return precautions.  - Referral placed for Urgent OBGYN referral and MFM.  - New OB labs collected and some results still pending.  - FHT appropriate for gestational age at time of discharge.  - Patient discharged from MAU in stable condition and may return to MAU as needed.   - Trich, yeast and BV positive. Patient left prior to Results complete. Not treated. Will have to notify [facility] during business hours on Monday Morning.   Claudette Head, MSN CNM  06/14/2023, 5:17 AM

## 2023-06-14 NOTE — MAU Note (Addendum)
.  [name] is a 32 y.o. at Unknown here in MAU reporting: ctx all day- back to back. Has not received prenatal care. Reports finding out she was pregnant around February. Denies VB or LOF. +FM. Hx of preeclampsia with last pregnancy. Reports HA. Denies visual disturbances or epigastric pain. Pt is accompanied by [employer] - pt states she just got arrested today.  LMP: unknown  Onset of complaint: all day  Pain score: 10 -  ctx; 6 -HA  Vitals:   06/14/23 0232  BP: (!) 156/91  Pulse: 76  Resp: 19  Temp: 97.7 F (36.5 C)  SpO2: 100%     FHT:144 Lab orders placed from triage:  UA

## 2023-06-14 NOTE — Telephone Encounter (Signed)
Call Placed to [facility] to inform patient results. Spoke with "Rosey Bath" in Medical and informed her that patient was seen in MAU last night and needed STD treatment.   Per Rosey Bath, please fax records over for documentation and patient will receive treatment.   Sent "Securely: via Fax.   Chianne Byrns Danella Deis) Suzie Portela, MSN, CNM  [facility]  06/14/2023 7:59 AM

## 2023-06-15 ENCOUNTER — Telehealth: Payer: Self-pay | Admitting: Obstetrics and Gynecology

## 2023-06-15 ENCOUNTER — Telehealth: Payer: Self-pay | Admitting: Certified Nurse Midwife

## 2023-06-15 LAB — RUBELLA SCREEN: Rubella: 1.36 {index} (ref >0.99)

## 2023-06-15 NOTE — Telephone Encounter (Signed)
According to HIPAA PRIVACY RULE,health care providers are allowed to share protected health information (PHI) for treatment purposes without patient authorization, as long as reasonable safeguards are used. This includes sharing information by fax, as long as we verify the recipient's fax number and use a cover sheet to mark the document as confidential.   Records will be faxed over to [facility] Fax [phone]

## 2023-06-15 NOTE — Telephone Encounter (Signed)
The front office received urgent message to get this patient scheduled this week for prenatal care , she is currently in [facility]  I called and spoke to Teira ( the Medial office) and I told her the Dr has requested from this patient to be seen this week. She told me "NO" we are not able to get her here this week, then she said I would need to fax over the patient records so the Medical  provider there can see why it was urgent. I will be forwarding an Email to Compliance at [facility] Per Izora Gala to see if records can be sent over without patient consent.
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
## 2023-06-16 LAB — CULTURE, BETA STREP (GROUP B ONLY)

## 2023-06-22 ENCOUNTER — Encounter (HOSPITAL_COMMUNITY): Payer: Self-pay | Admitting: Obstetrics and Gynecology

## 2023-06-22 ENCOUNTER — Inpatient Hospital Stay (HOSPITAL_COMMUNITY)
Admission: AD | Admit: 2023-06-22 | Discharge: 2023-06-24 | DRG: 806 | Payer: Medicaid Other | Attending: Obstetrics and Gynecology | Admitting: Obstetrics and Gynecology

## 2023-06-22 DIAGNOSIS — O134 Gestational [pregnancy-induced] hypertension without significant proteinuria, complicating childbirth: Secondary | ICD-10-CM | POA: Diagnosis not present

## 2023-06-22 DIAGNOSIS — A5901 Trichomonal vulvovaginitis: Secondary | ICD-10-CM | POA: Diagnosis present

## 2023-06-22 DIAGNOSIS — O99824 Streptococcus B carrier state complicating childbirth: Secondary | ICD-10-CM | POA: Diagnosis present

## 2023-06-22 DIAGNOSIS — Z3A35 35 weeks gestation of pregnancy: Secondary | ICD-10-CM

## 2023-06-22 DIAGNOSIS — F17210 Nicotine dependence, cigarettes, uncomplicated: Secondary | ICD-10-CM | POA: Diagnosis present

## 2023-06-22 DIAGNOSIS — D509 Iron deficiency anemia, unspecified: Secondary | ICD-10-CM | POA: Diagnosis not present

## 2023-06-22 DIAGNOSIS — O99324 Drug use complicating childbirth: Secondary | ICD-10-CM | POA: Diagnosis present

## 2023-06-22 DIAGNOSIS — O9832 Other infections with a predominantly sexual mode of transmission complicating childbirth: Secondary | ICD-10-CM | POA: Diagnosis present

## 2023-06-22 DIAGNOSIS — O99334 Smoking (tobacco) complicating childbirth: Secondary | ICD-10-CM | POA: Diagnosis present

## 2023-06-22 DIAGNOSIS — O9902 Anemia complicating childbirth: Secondary | ICD-10-CM | POA: Diagnosis present

## 2023-06-22 DIAGNOSIS — F1110 Opioid abuse, uncomplicated: Secondary | ICD-10-CM | POA: Diagnosis not present

## 2023-06-22 DIAGNOSIS — A568 Sexually transmitted chlamydial infection of other sites: Secondary | ICD-10-CM | POA: Diagnosis present

## 2023-06-22 DIAGNOSIS — F1210 Cannabis abuse, uncomplicated: Secondary | ICD-10-CM | POA: Diagnosis present

## 2023-06-22 DIAGNOSIS — O4202 Full-term premature rupture of membranes, onset of labor within 24 hours of rupture: Secondary | ICD-10-CM | POA: Diagnosis not present

## 2023-06-22 DIAGNOSIS — O0933 Supervision of pregnancy with insufficient antenatal care, third trimester: Secondary | ICD-10-CM | POA: Diagnosis not present

## 2023-06-22 DIAGNOSIS — O99320 Drug use complicating pregnancy, unspecified trimester: Secondary | ICD-10-CM | POA: Diagnosis present

## 2023-06-22 DIAGNOSIS — O98819 Other maternal infectious and parasitic diseases complicating pregnancy, unspecified trimester: Secondary | ICD-10-CM | POA: Diagnosis present

## 2023-06-22 HISTORY — DX: Trichomonal vulvovaginitis: A59.01

## 2023-06-22 LAB — COMPREHENSIVE METABOLIC PANEL
ALT: 12 U/L (ref 0–44)
AST: 19 U/L (ref 15–41)
Albumin: 2.8 g/dL — ABNORMAL LOW (ref 3.5–5.0)
Alkaline Phosphatase: 139 U/L — ABNORMAL HIGH (ref 38–126)
Anion gap: 11 (ref 5–15)
BUN: 11 mg/dL (ref 6–20)
CO2: 20 mmol/L — ABNORMAL LOW (ref 22–32)
Calcium: 9.3 mg/dL (ref 8.9–10.3)
Chloride: 102 mmol/L (ref 98–111)
Creatinine, Ser: 0.79 mg/dL (ref 0.44–1.00)
GFR, Estimated: >60 mL/min (ref >60)
Glucose, Bld: 83 mg/dL (ref 70–99)
Potassium: 4.1 mmol/L (ref 3.5–5.1)
Sodium: 133 mmol/L — ABNORMAL LOW (ref 135–145)
Total Bilirubin: 0.3 mg/dL (ref <1.2)
Total Protein: 7.1 g/dL (ref 6.5–8.1)

## 2023-06-22 LAB — CBC
HCT: 44.4 % (ref 36.0–46.0)
Hemoglobin: 13.5 g/dL (ref 12.0–15.0)
MCH: 27.2 pg (ref 26.0–34.0)
MCHC: 30.4 g/dL (ref 30.0–36.0)
MCV: 89.3 fL (ref 80.0–100.0)
Platelets: 342 10*3/uL (ref 150–400)
RBC: 4.97 MIL/uL (ref 3.87–5.11)
RDW: 13.5 % (ref 11.5–15.5)
WBC: 15.9 10*3/uL — ABNORMAL HIGH (ref 4.0–10.5)
nRBC: 0 % (ref 0.0–0.2)

## 2023-06-22 LAB — TYPE AND SCREEN
ABO/RH(D): O POS
Antibody Screen: NEGATIVE

## 2023-06-22 LAB — HEMOGLOBIN A1C
Hgb A1c MFr Bld: 5.1 % (ref 4.8–5.6)
Mean Plasma Glucose: 99.67 mg/dL

## 2023-06-22 LAB — RPR: RPR Ser Ql: NONREACTIVE

## 2023-06-22 LAB — GROUP B STREP BY PCR: Group B strep by PCR: NEGATIVE

## 2023-06-22 MED ORDER — MISOPROSTOL 200 MCG PO TABS
ORAL_TABLET | ORAL | Status: AC
  Filled 2023-06-22: qty 5

## 2023-06-22 MED ORDER — DIBUCAINE (PERIANAL) 1 % EX OINT: 1.0000 | TOPICAL_OINTMENT | CUTANEOUS | Status: DC | PRN

## 2023-06-22 MED ORDER — MEASLES, MUMPS & RUBELLA VAC IJ SOLR: 0.5000 mL | Freq: Once | INTRAMUSCULAR | Status: DC

## 2023-06-22 MED ORDER — DIPHENHYDRAMINE HCL 25 MG PO CAPS: 25.0000 mg | ORAL_CAPSULE | Freq: Four times a day (QID) | ORAL | Status: DC | PRN

## 2023-06-22 MED ORDER — ACETAMINOPHEN 325 MG PO TABS: 650.0000 mg | ORAL_TABLET | ORAL | Status: DC | PRN

## 2023-06-22 MED ORDER — OXYTOCIN-SODIUM CHLORIDE 30-0.9 UT/500ML-% IV SOLN
2.5000 [IU]/h | INTRAVENOUS | Status: DC
  Administered 2023-06-22: 2.5 [IU]/h via INTRAVENOUS
  Filled 2023-06-22: qty 500

## 2023-06-22 MED ORDER — SODIUM CHLORIDE 0.9 % IV SOLN
2.0000 g | Freq: Once | INTRAVENOUS | Status: AC
  Administered 2023-06-22: 2 g via INTRAVENOUS
  Filled 2023-06-22: qty 2000

## 2023-06-22 MED ORDER — FENTANYL CITRATE (PF) 100 MCG/2ML IJ SOLN
INTRAMUSCULAR | Status: AC
  Administered 2023-06-22: 100 ug
  Filled 2023-06-22: qty 4

## 2023-06-22 MED ORDER — AZITHROMYCIN 250 MG PO TABS
1000.0000 mg | ORAL_TABLET | Freq: Once | ORAL | Status: AC
  Administered 2023-06-22: 1000 mg via ORAL
  Filled 2023-06-22: qty 4

## 2023-06-22 MED ORDER — SOD CITRATE-CITRIC ACID 500-334 MG/5ML PO SOLN: 30.0000 mL | ORAL | Status: DC | PRN

## 2023-06-22 MED ORDER — CARBOPROST TROMETHAMINE 250 MCG/ML IM SOLN
INTRAMUSCULAR | Status: AC
  Filled 2023-06-22: qty 1

## 2023-06-22 MED ORDER — HYDRALAZINE HCL 20 MG/ML IJ SOLN: 10.0000 mg | INTRAMUSCULAR | Status: DC | PRN

## 2023-06-22 MED ORDER — WITCH HAZEL-GLYCERIN EX PADS: 1.0000 | MEDICATED_PAD | CUTANEOUS | Status: DC | PRN

## 2023-06-22 MED ORDER — TRANEXAMIC ACID-NACL 1000-0.7 MG/100ML-% IV SOLN
1000.0000 mg | INTRAVENOUS | Status: AC
  Administered 2023-06-22: 1000 mg via INTRAVENOUS

## 2023-06-22 MED ORDER — IBUPROFEN 600 MG PO TABS
600.0000 mg | ORAL_TABLET | Freq: Four times a day (QID) | ORAL | Status: DC
  Administered 2023-06-22 – 2023-06-24 (×8): 600 mg via ORAL
  Filled 2023-06-22 (×7): qty 1

## 2023-06-22 MED ORDER — METHYLERGONOVINE MALEATE 0.2 MG PO TABS: 0.2000 mg | ORAL_TABLET | ORAL | Status: DC | PRN

## 2023-06-22 MED ORDER — OXYCODONE-ACETAMINOPHEN 5-325 MG PO TABS
1.0000 | ORAL_TABLET | ORAL | Status: DC | PRN
  Administered 2023-06-22: 1 via ORAL
  Filled 2023-06-22: qty 1

## 2023-06-22 MED ORDER — SIMETHICONE 80 MG PO CHEW: 80.0000 mg | CHEWABLE_TABLET | ORAL | Status: DC | PRN

## 2023-06-22 MED ORDER — LABETALOL HCL 5 MG/ML IV SOLN: 80.0000 mg | INTRAVENOUS | Status: DC | PRN

## 2023-06-22 MED ORDER — ONDANSETRON HCL 4 MG/2ML IJ SOLN: 4.0000 mg | Freq: Four times a day (QID) | INTRAMUSCULAR | Status: DC | PRN

## 2023-06-22 MED ORDER — ZOLPIDEM TARTRATE 5 MG PO TABS: 5.0000 mg | ORAL_TABLET | Freq: Every evening | ORAL | Status: DC | PRN

## 2023-06-22 MED ORDER — FLEET ENEMA RE ENEM: 1.0000 | ENEMA | RECTAL | Status: DC | PRN

## 2023-06-22 MED ORDER — SODIUM CHLORIDE 0.9 % IV SOLN: 250.0000 mL | INTRAVENOUS | Status: DC | PRN

## 2023-06-22 MED ORDER — LIDOCAINE HCL (PF) 1 % IJ SOLN
30.0000 mL | INTRAMUSCULAR | Status: DC | PRN
  Filled 2023-06-22: qty 30

## 2023-06-22 MED ORDER — OXYCODONE-ACETAMINOPHEN 5-325 MG PO TABS: 2.0000 | ORAL_TABLET | ORAL | Status: DC | PRN

## 2023-06-22 MED ORDER — OXYTOCIN BOLUS FROM INFUSION
333.0000 mL | Freq: Once | INTRAVENOUS | Status: AC
  Administered 2023-06-22: 333 mL via INTRAVENOUS

## 2023-06-22 MED ORDER — FUROSEMIDE 10 MG/ML IJ SOLN
40.0000 mg | Freq: Once | INTRAMUSCULAR | Status: AC
Start: 2023-06-22 — End: 2023-06-22
  Administered 2023-06-22: 40 mg via INTRAVENOUS
  Filled 2023-06-22: qty 4

## 2023-06-22 MED ORDER — SODIUM CHLORIDE 0.9% FLUSH: 3.0000 mL | INTRAVENOUS | Status: DC | PRN

## 2023-06-22 MED ORDER — BENZOCAINE-MENTHOL 20-0.5 % EX AERO: 1.0000 | INHALATION_SPRAY | CUTANEOUS | Status: DC | PRN

## 2023-06-22 MED ORDER — METHYLERGONOVINE MALEATE 0.2 MG/ML IJ SOLN: 0.2000 mg | INTRAMUSCULAR | Status: DC | PRN

## 2023-06-22 MED ORDER — PRENATAL MULTIVITAMIN CH
1.0000 | ORAL_TABLET | Freq: Every day | ORAL | Status: DC
  Administered 2023-06-23 – 2023-06-24 (×2): 1 via ORAL
  Filled 2023-06-22 (×2): qty 1

## 2023-06-22 MED ORDER — SODIUM CHLORIDE 0.9 % IV SOLN: 1.0000 g | INTRAVENOUS | Status: DC

## 2023-06-22 MED ORDER — ERYTHROMYCIN 5 MG/GM OP OINT
TOPICAL_OINTMENT | OPHTHALMIC | Status: AC
  Filled 2023-06-22: qty 1

## 2023-06-22 MED ORDER — SODIUM CHLORIDE 0.9% FLUSH: 3.0000 mL | Freq: Two times a day (BID) | INTRAVENOUS | Status: DC

## 2023-06-22 MED ORDER — ONDANSETRON HCL 4 MG PO TABS: 4.0000 mg | ORAL_TABLET | ORAL | Status: DC | PRN

## 2023-06-22 MED ORDER — TRANEXAMIC ACID-NACL 1000-0.7 MG/100ML-% IV SOLN
INTRAVENOUS | Status: AC
  Filled 2023-06-22: qty 100

## 2023-06-22 MED ORDER — LABETALOL HCL 5 MG/ML IV SOLN: 40.0000 mg | INTRAVENOUS | Status: DC | PRN

## 2023-06-22 MED ORDER — MISOPROSTOL 200 MCG PO TABS
1000.0000 ug | ORAL_TABLET | Freq: Once | ORAL | Status: AC
  Administered 2023-06-22: 1000 ug via RECTAL

## 2023-06-22 MED ORDER — LABETALOL HCL 5 MG/ML IV SOLN: 20.0000 mg | INTRAVENOUS | Status: DC | PRN

## 2023-06-22 MED ORDER — SENNOSIDES-DOCUSATE SODIUM 8.6-50 MG PO TABS
2.0000 | ORAL_TABLET | ORAL | Status: DC
  Administered 2023-06-23 – 2023-06-24 (×2): 2 via ORAL
  Filled 2023-06-22 (×2): qty 2

## 2023-06-22 MED ORDER — LACTATED RINGERS IV SOLN: 500.0000 mL | INTRAVENOUS | Status: DC | PRN

## 2023-06-22 MED ORDER — METRONIDAZOLE 500 MG PO TABS: 500.0000 mg | ORAL_TABLET | Freq: Two times a day (BID) | ORAL | Status: DC

## 2023-06-22 MED ORDER — COCONUT OIL OIL: 1.0000 | TOPICAL_OIL | Status: DC | PRN

## 2023-06-22 MED ORDER — TETANUS-DIPHTH-ACELL PERTUSSIS 5-2.5-18.5 LF-MCG/0.5 IM SUSY: 0.5000 mL | PREFILLED_SYRINGE | Freq: Once | INTRAMUSCULAR | Status: DC

## 2023-06-22 MED ORDER — METHYLERGONOVINE MALEATE 0.2 MG/ML IJ SOLN
INTRAMUSCULAR | Status: AC
  Administered 2023-06-22: 0.2 mg
  Filled 2023-06-22: qty 1

## 2023-06-22 MED ORDER — METRONIDAZOLE 500 MG PO TABS
2000.0000 mg | ORAL_TABLET | Freq: Once | ORAL | Status: AC
  Administered 2023-06-22: 2000 mg via ORAL
  Filled 2023-06-22: qty 4

## 2023-06-22 MED ORDER — ONDANSETRON HCL 4 MG/2ML IJ SOLN: 4.0000 mg | INTRAMUSCULAR | Status: DC | PRN

## 2023-06-22 MED ORDER — LACTATED RINGERS IV SOLN: INTRAVENOUS | Status: DC

## 2023-06-22 NOTE — Progress Notes (Addendum)
Called [facility] and spoke with "nurse Marchelle Folks" within Medical:  They confirm that pt only received Azithromycin 1g on 12/3.    Relayed this information to Peds team as well.   Will start pt on metronidazole 500mg  BID for Trichomonas   Mittie Bodo, MD Family Medicine - Obstetrics Fellow    Update: Pt had alternative tx with metronidazole 2g IV and spoke with pharmacy and shouldn't need more treatment given concerns for treatment completion given incarceration.    Mittie Bodo, MD Family Medicine - Obstetrics Fellow

## 2023-06-22 NOTE — Discharge Summary (Addendum)
Postpartum Discharge Summary      Patient Name: [name] DOB: 05-05-1991 MRN: [id number]  Date of admission: 06/22/2023 Delivery date:06/22/2023 Delivering provider: CHUBB, CASEY C Date of discharge: 06/24/2023  Admitting diagnosis: Preterm labor [O60.00] Intrauterine pregnancy: [redacted]w[redacted]d     Secondary diagnosis:  Principal Problem:   Preterm labor Active Problems:   Drug use complicating pregnancy   Iron deficiency anemia during pregnancy   Chlamydia infection affecting pregnancy  Additional problems: Incarceration, Polysubstance abuse, No prenatal care, Trichomonas and Chlamydial infection    Discharge diagnosis: Preterm Pregnancy Delivered and Gestational Hypertension                                              Post partum procedures: None Augmentation: N/A Complications: None  Hospital course: Onset of Labor With Vaginal Delivery      32 y.o. yo [id number] at [redacted]w[redacted]d was admitted in Active Labor on 06/22/2023. Labor course was complicated by GBS positive and precipitous delivery following SROM not allowing adequate ppx prior to delivery.  Membrane Rupture Time/Date: 12:58 PM,06/22/2023  Delivery Method:Vaginal, Spontaneous Operative Delivery:N/A Episiotomy: None Lacerations:  None Patient had an uncomplicated postpartum course.  She is ambulating, tolerating a regular diet, passing flatus, and urinating well. Patient is discharged home in stable condition on 06/24/23.  Newborn Data: Birth date:06/22/2023 Birth time:1:07 PM Gender:Female Living status:Living Apgars:9 ,9  Weight:3070 g (6lb 12.3oz)  Magnesium Sulfate received: No BMZ received: No Rhophylac:N/A MMR:N/A T-DaP:ordered Flu: No RSV Vaccine received: No Transfusion:No  Immunizations received: There is no immunization history for the selected administration types on file for this patient.  Physical exam  Vitals:   06/23/23 0520 06/23/23 1715 06/23/23 2059 06/24/23 0517  BP: 127/77 124/81  125/82 131/82  Pulse: 90 91    Resp:  17 18 18   Temp: 98.3 F (36.8 C) 97.9 F (36.6 C)  98.1 F (36.7 C)  TempSrc:  Oral Oral Oral  SpO2: 99% 100% 99% 99%   General: alert, cooperative, and no distress Lochia: appropriate Uterine Fundus: firm Incision: N/A DVT Evaluation: No evidence of DVT seen on physical exam. Labs: Lab Results  Component Value Date   WBC 16.4 (H) 06/23/2023   HGB 9.0 (L) 06/23/2023   HCT 26.8 (L) 06/23/2023   MCV 82.0 06/23/2023   PLT 245 06/23/2023      Latest Ref Rng & Units 06/22/2023   12:44 PM  CMP  Glucose 70 - 99 mg/dL 83   BUN 6 - 20 mg/dL 11   Creatinine 1.47 - 1.00 mg/dL 8.29   Sodium 562 - 130 mmol/L 133   Potassium 3.5 - 5.1 mmol/L 4.1   Chloride 98 - 111 mmol/L 102   CO2 22 - 32 mmol/L 20   Calcium 8.9 - 10.3 mg/dL 9.3   Total Protein 6.5 - 8.1 g/dL 7.1   Total Bilirubin <8.6 mg/dL 0.3   Alkaline Phos 38 - 126 U/L 139   AST 15 - 41 U/L 19   ALT 0 - 44 U/L 12    Edinburgh Score:    06/22/2023    4:12 PM  Edinburgh Postnatal Depression Scale Screening Tool  I have been able to laugh and see the funny side of things. 2  I have looked forward with enjoyment to things. 2  I have blamed myself unnecessarily when things went wrong. 2  I have been anxious or worried for no good reason. 2  I have felt scared or panicky for no good reason. 3  Things have been getting on top of me. 3  I have been so unhappy that I have had difficulty sleeping. 2  I have felt sad or miserable. 2  I have been so unhappy that I have been crying. 2  The thought of harming myself has occurred to me. 0  Edinburgh Postnatal Depression Scale Total 20   No data recorded  After visit meds:  Allergies as of 06/24/2023   No Known Allergies      Medication List     STOP taking these medications    norethindrone 0.35 MG tablet Commonly known as: MICRONOR       TAKE these medications    ferrous sulfate 325 (65 FE) MG tablet Take 1 tablet (325  mg total) by mouth daily.   furosemide 20 MG tablet Commonly known as: LASIX Take 1 tablet (20 mg total) by mouth daily.   ibuprofen 600 MG tablet Commonly known as: ADVIL Take 1 tablet (600 mg total) by mouth every 6 (six) hours. What changed:  when to take this reasons to take this   NIFEdipine 30 MG 24 hr tablet Commonly known as: ADALAT CC Take 1 tablet (30 mg total) by mouth daily. What changed:  medication strength how much to take when to take this         Discharge home in stable condition Infant Feeding: Bottle Infant Disposition: mother back to jail, baby being placed with  aunt [name] in conjunction with CSW/CPS Discharge instruction: per After Visit Summary and Postpartum booklet. Activity: Advance as tolerated. Pelvic rest for 6 weeks.  Diet: routine diet Future Appointments: No future appointments.  Follow up Visit:   Please schedule this patient for a In person postpartum visit in 4 weeks with the following provider: Any provider. Additional Postpartum F/U:BP check 1 week  High risk pregnancy complicated by: HTN and no PNC, Chlamydia / Trichomonas infections third trimester  Delivery mode:  Vaginal, Spontaneous Anticipated Birth Control:   plans Depo at d/c   06/24/2023 Monica Dorothyann Gibbs, MD  CNM attestation I have seen and examined this patient and agree with above documentation in the resident's note.   JOBINA [name] is a 31 y.o. [phone] s/p vag delivery.   Pain is well controlled.  Plan for birth control is  Depo ordered prior to d/c .  Method of Feeding: bottle  PE:  BP 131/82 (BP Location: Left Arm)   Pulse 91   Temp 98.1 F (36.7 C) (Oral)   Resp 18   SpO2 99%   Breastfeeding Unknown  Fundus firm  Recent Labs    06/22/23 1254 06/23/23 0422  HGB 13.5 9.0*  HCT 44.4 26.8*     Plan: discharge today - postpartum care discussed - f/u clinic in 1wk for RN BP check and 6 weeks for postpartum visit (message sent/resent to  [facility])  [name], CNM 8:56 AM 06/24/2023

## 2023-06-22 NOTE — MAU Note (Signed)
.  [name] is a 32 y.o. at [redacted]w[redacted]d here in MAU reporting: ctx that started 2-3 hours ago. Dr. Alvester Morin called to bedside at 1210 due to patient status.   Contractions every: 2 minutes Onset of ctx: 2-3 hours ago Pain score: 10/10  ROM: denies Vaginal Bleeding: denies Last SVE: none Labor Pain Management Plan: Planning epidural  Fetal Movement: Reports positive FM FHT: Fetal Heart Rate Mode: External Baseline Rate (A): 145 bpm (intermittent tracing due to patient frequent positioning for comfort)  OB Office: Faculty; patient reports that [facility] the jail and has not received PNC GBS: Unknown Lab orders placed from triage: Notified Provider

## 2023-06-22 NOTE — Lactation Note (Signed)
This note was copied from a baby's chart. Lactation Consultation Note  Patient Name: [name] Date: 06/22/2023 Age:32 hours Reason for consult: Initial assessment  [name] is formula feeding only. Please consult lactation team if [name] changes feeding plan.  Feeding Mother's Current Feeding Choice: Formula  Consult Status Consult Status: Complete Date: 06/22/23    Dema Severin BS, IBCLC 06/22/2023, 3:11 PM
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
## 2023-06-22 NOTE — H&P (Addendum)
OBSTETRIC ADMISSION HISTORY AND PHYSICAL  [name] is a 32 y.o. female [id number] with IUP at [redacted]w[redacted]d by late [place] presenting from local jail for PTL in s/o no PNC. She reports +FMs, No LOF, no VB, no blurry vision, headaches or peripheral edema, and RUQ pain.  She plans on formula feeding. She is undecided about birth control. She did not receive prenatal care except for MAU visit on 06/14/23 2/2 incarceration.   Dating: By late [place] --->  Estimated Date of Delivery: 07/23/23  Sono:    @[redacted]w[redacted]d , CWD, normal anatomy, cephalic presentation, R lateral placental lie, 2536g, 58% EFW   Prenatal History/Complications:  Incarceration Polysubstance abuse (Recent Meth and Marijuana use) No prenatal care Hx of severe PreE in prior pregnancy     Past Medical History: Past Medical History:  Diagnosis Date   AKI (acute kidney injury) (HCC)    Cr 1.63 on presentation to L&D     Polysubstance abuse (HCC)     Past Surgical History: Past Surgical History:  Procedure Laterality Date   DILATION AND CURETTAGE OF UTERUS      Obstetrical History: OB History     Gravida  9   Para  5   Term  3   Preterm  2   AB  3   Living  5      SAB  1   IAB  2   Ectopic      Multiple  0   Live Births  5           Social History Social History   Socioeconomic History   Marital status: Single    Spouse name: Not on file   Number of children: Not on file   Years of education: Not on file   Highest education level: Not on file  Occupational History   Not on file  Tobacco Use   Smoking status: Every Day    Current packs/day: 0.50    Types: Cigarettes   Smokeless tobacco: Former   Tobacco comments:    smoked cigarettes a few hours ago  Vaping Use   Vaping status: Some Days  Substance and Sexual Activity   Alcohol use: Not Currently    Comment: Weekends.    Drug use: Yes    Types: Marijuana, Methamphetamines    Comment: last used 12/1   Sexual activity: Yes    Birth  control/protection: None  Other Topics Concern   Not on file  Social History Narrative   Not on file   Social Determinants of Health   Financial Resource Strain: Not on file  Food Insecurity: Patient Declined (06/22/2023)   Hunger Vital Sign    Worried About Running Out of Food in the Last Year: Patient declined    Barista in the Last Year: Patient declined  Transportation Needs: Not on file  Physical Activity: Not on file  Stress: Not on file  Social Connections: Not on file    Family History: Family History  Problem Relation Age of Onset   Other Neg Hx     Allergies: No Known Allergies  Medications Prior to Admission  Medication Sig Dispense Refill Last Dose   ibuprofen (ADVIL) 600 MG tablet Take 1 tablet (600 mg total) by mouth every 6 (six) hours as needed for mild pain or moderate pain. 30 tablet 0    NIFEdipine (ADALAT CC) 60 MG 24 hr tablet Take 1 tablet (60 mg total) by mouth 2 (two) times daily. 60  tablet 0    norethindrone (MICRONOR) 0.35 MG tablet Take 1 tablet (0.35 mg total) by mouth daily. 28 tablet 0      Review of Systems   All systems reviewed and negative except as stated in HPI  Blood pressure (!) 156/91, pulse 69, resp. rate 18, unknown if currently breastfeeding. General appearance: alert, cooperative, and appears stated age Lungs: clear to auscultation bilaterally Heart: regular rate and rhythm Abdomen: soft, non-tender; bowel sounds normal Extremities: Homans sign is negative, no sign of DVT Presentation: cephalic Fetal monitoringBaseline: 145 bpm, Variability: Good {> 6 bpm), Accelerations: Reactive, and Decelerations: Absent Uterine activityFrequency: Every 5-10 minutes and Intensity: strong  Dilation: 10 Effacement (%): 100 Station: Plus 2 Exam by:: Judd Lien MD   Prenatal labs: ABO, Rh: --/--/O POS (12/10 1245) Antibody: NEG (12/10 1245) Rubella: 1.36 (12/02 0318) RPR: NON REACTIVE (12/10 1244)  HBsAg: NON REACTIVE (12/02  0318)  HIV: NON REACTIVE, Non Reactive (12/02 0318)  GBS: PRESUMPTIVE NEGATIVE/-- (12/10 1230) unknown  Glucola none available but hx of 5.7 A1c in 2022 Genetic screening  not done  Anatomy US limited 2/2 position and later term   Prenatal Transfer Tool  Maternal Diabetes: No Genetic Screening: none Maternal Ultrasounds/Referrals: Normal Fetal Ultrasounds or other Referrals:  None Maternal Substance Abuse:  Yes:  Type: Marijuana, Other: methamphetamines Significant Maternal Medications:  None Significant Maternal Lab Results:  None Number of Prenatal Visits:Less than or equal to 3 verified prenatal visits Other Comments:  None  Results for orders placed or performed during the hospital encounter of 06/22/23 (from the past 24 hour(s))  Group B strep by PCR   Collection Time: 06/22/23 12:30 PM   Specimen: Vaginal/Rectal; Genital  Result Value Ref Range   Group B strep by PCR PRESUMPTIVE NEGATIVE PRESUMPTIVE NEGATIVE  RPR   Collection Time: 06/22/23 12:44 PM  Result Value Ref Range   RPR Ser Ql NON REACTIVE NON REACTIVE  Comprehensive metabolic panel   Collection Time: 06/22/23 12:44 PM  Result Value Ref Range   Sodium 133 (L) 135 - 145 mmol/L   Potassium 4.1 3.5 - 5.1 mmol/L   Chloride 102 98 - 111 mmol/L   CO2 20 (L) 22 - 32 mmol/L   Glucose, Bld 83 70 - 99 mg/dL   BUN 11 6 - 20 mg/dL   Creatinine, Ser 8.29 0.44 - 1.00 mg/dL   Calcium 9.3 8.9 - 56.2 mg/dL   Total Protein 7.1 6.5 - 8.1 g/dL   Albumin 2.8 (L) 3.5 - 5.0 g/dL   AST 19 15 - 41 U/L   ALT 12 0 - 44 U/L   Alkaline Phosphatase 139 (H) 38 - 126 U/L   Total Bilirubin 0.3 <1.2 mg/dL   GFR, Estimated >13 >08 mL/min   Anion gap 11 5 - 15  Hemoglobin A1c   Collection Time: 06/22/23 12:45 PM  Result Value Ref Range   Hgb A1c MFr Bld 5.1 4.8 - 5.6 %   Mean Plasma Glucose 99.67 mg/dL  Type and screen [facility]   Collection Time: 06/22/23 12:45 PM  Result Value Ref Range   ABO/RH(D) O POS     Antibody Screen NEG    Sample Expiration      06/25/2023,2359 Performed at [facility], [address], [place], [place] [address]   CBC   Collection Time: 06/22/23 12:54 PM  Result Value Ref Range   WBC 15.9 (H) 4.0 - 10.5 K/uL   RBC 4.97 3.87 - 5.11  MIL/uL   Hemoglobin 13.5 12.0 - 15.0 g/dL   HCT 09.8 11.9 - 14.7 %   MCV 89.3 80.0 - 100.0 fL   MCH 27.2 26.0 - 34.0 pg   MCHC 30.4 30.0 - 36.0 g/dL   RDW 82.9 56.2 - 13.0 %   Platelets 342 150 - 400 K/uL   nRBC 0.0 0.0 - 0.2 %    Patient Active Problem List   Diagnosis Date Noted   Preterm labor 06/22/2023   Gestational hypertension 06/14/2023   Incarceration 06/14/2023   History of severe pre-eclampsia 11/23/2020   Polysubstance abuse (HCC)    Tobacco use    No prenatal care in current pregnancy    Trichomonal vaginitis during pregnancy     Assessment/Plan:  JEWELZ PAPANIA is a 32 y.o. [id number] at [redacted]w[redacted]d here for preterm labor.  #Late [facility] #Preterm Labor #Polysubstance Use Disorder (meth and MJ use) - UDS pending #GHTN with hx of Severe PreE #Trich and Chlamydia Positive 06/14/23 - did not have treatment, unclear if jail notified depsite documentation that provider had called and made recommendations -Admit to L&D -IV pain meds/epidural patient's choice  -Anticipate SVD             -Anticipate formula feed -Contraception: TBD  #FWB: Cat 1  Gwenlyn Perking, MD Visiting Resident, PGY-2 Family Medicine  Evaluation and management procedures were performed by the [facility] Medicine Resident under my supervision. I was immediately available for direct supervision, assistance and direction throughout this encounter.  I also confirm that I have verified the information documented in the resident's note, and that I have also personally reperformed the pertinent components of the physical exam and all of the medical decision making activities.  I have also made any necessary editorial changes.   Mittie Bodo, MD Family  Medicine - Obstetrics Fellow

## 2023-06-23 ENCOUNTER — Encounter (HOSPITAL_COMMUNITY): Payer: Self-pay | Admitting: Obstetrics and Gynecology

## 2023-06-23 DIAGNOSIS — A749 Chlamydial infection, unspecified: Secondary | ICD-10-CM

## 2023-06-23 DIAGNOSIS — O98819 Other maternal infectious and parasitic diseases complicating pregnancy, unspecified trimester: Secondary | ICD-10-CM | POA: Diagnosis present

## 2023-06-23 DIAGNOSIS — D509 Iron deficiency anemia, unspecified: Secondary | ICD-10-CM | POA: Diagnosis present

## 2023-06-23 DIAGNOSIS — O99320 Drug use complicating pregnancy, unspecified trimester: Secondary | ICD-10-CM | POA: Diagnosis present

## 2023-06-23 HISTORY — DX: Chlamydial infection, unspecified: A74.9

## 2023-06-23 LAB — CBC
HCT: 26.8 % — ABNORMAL LOW (ref 36.0–46.0)
Hemoglobin: 9 g/dL — ABNORMAL LOW (ref 12.0–15.0)
MCH: 27.5 pg (ref 26.0–34.0)
MCHC: 33.6 g/dL (ref 30.0–36.0)
MCV: 82 fL (ref 80.0–100.0)
Platelets: 245 10*3/uL (ref 150–400)
RBC: 3.27 MIL/uL — ABNORMAL LOW (ref 3.87–5.11)
RDW: 13.2 % (ref 11.5–15.5)
WBC: 16.4 10*3/uL — ABNORMAL HIGH (ref 4.0–10.5)
nRBC: 0 % (ref 0.0–0.2)

## 2023-06-23 LAB — GC/CHLAMYDIA PROBE AMP (~~LOC~~) NOT AT ARMC
Chlamydia: POSITIVE — AB
Comment: NEGATIVE
Comment: NORMAL
Neisseria Gonorrhea: NEGATIVE

## 2023-06-23 MED ORDER — MEDROXYPROGESTERONE ACETATE 150 MG/ML IM SUSP
150.0000 mg | Freq: Once | INTRAMUSCULAR | Status: AC
  Administered 2023-06-24: 150 mg via INTRAMUSCULAR
  Filled 2023-06-23: qty 1

## 2023-06-23 MED ORDER — FERROUS SULFATE 325 (65 FE) MG PO TABS
325.0000 mg | ORAL_TABLET | Freq: Every day | ORAL | Status: DC
  Administered 2023-06-23 – 2023-06-24 (×2): 325 mg via ORAL
  Filled 2023-06-23 (×2): qty 1

## 2023-06-23 MED ORDER — NIFEDIPINE ER OSMOTIC RELEASE 30 MG PO TB24
30.0000 mg | ORAL_TABLET | Freq: Every day | ORAL | Status: DC
  Administered 2023-06-23 – 2023-06-24 (×2): 30 mg via ORAL
  Filled 2023-06-23 (×2): qty 1

## 2023-06-23 MED ORDER — FUROSEMIDE 20 MG PO TABS
20.0000 mg | ORAL_TABLET | Freq: Every day | ORAL | Status: DC
  Administered 2023-06-23 – 2023-06-24 (×2): 20 mg via ORAL
  Filled 2023-06-23 (×2): qty 1

## 2023-06-23 NOTE — Clinical Social Work Maternal (Signed)
CLINICAL SOCIAL WORK MATERNAL/CHILD NOTE  Patient Details  Name: [name] MRN: [id number] Date of Birth: 11-Feb-1991  Date:  06/23/2023  Clinical Social Worker Initiating Note:  Enos Fling Date/Time: Initiated:  06/23/23/1223     Child's Name:      Biological Parents:  [name] 1990-08-25 did not want to name FOB)   Need for Interpreter:  None   Reason for Referral:  Current Incarceration, Current Substance Use/Substance Use During Pregnancy  , Behavioral Health Concerns   Address:  [address] [place] [address]    Phone number:  [phone] (home)     Additional phone number:   Household Members/Support Persons (HM/SP):       HM/SP Name Relationship DOB or Age  HM/SP -1        HM/SP -2        HM/SP -3        HM/SP -4        HM/SP -5        HM/SP -6        HM/SP -7        HM/SP -8          Natural Supports (not living in the home):      Professional Supports: None   Employment: Unemployed   Type of Work:     Education:  9 to 11 years   Homebound arranged:    Surveyor, quantity Resources:      Other Resources:      Cultural/Religious Considerations Which May Impact Care:    Strengths:      Psychotropic Medications:         Pediatrician:       Pediatrician List:   [name]      Pediatrician Fax Number:    Risk Factors/Current Problems:      Cognitive State:  Able to Concentrate  , Alert  , Linear Thinking  , Insightful  , Goal Oriented     Mood/Affect:  Interested  , Comfortable  , Calm  , Relaxed     CSW Assessment: CSW received a consult for a incarcerated [name], No PNC and substance use during pregnancy. CSW met [name] at bedside to complete a full psychosocial assessment. CSW entered the room, introduced herself and explained the reason for the assessment. [name] presented bonding/holding the infant and was calm, agreeable to  consult and remained engaged throughout encounter.  CSW collected [name]'s demographic information and she reported having 5 other kids [name] 05-14-2005, [name] 09-09-2008, [name] 08-15-2010 and [name] 01-27-2015 all reside with their father [name] and lastly we have [name] 11-24-2020 was adopted by [name]'s aunt. CSW asked [name] about why she is currently incarcerated. [name] reported charges which included probation violation and she is hoping to post bail; however does not the resources at this time. [name] reported at this time she has not been sentenced and she is expected to meet with the judge next month. CSW asked [name] does she have a safe discharge plan for the infant. [name] reported [name] as a "possibility" for the infant to be discharged too; otherwise she has minimal support. CSW reported to [name] that we would contact her aunt [name] once she is discharged otherwise will make a CPS report; [name] was understanding.   CSW inquired about [name]'s mental health  history. [name] reported being diagnosed with Bipolar, anxiety and depression; however she could not recall when she was diagnoses. [name] reported her Bipolar as maniac and depressive and could not remember ever having a episode. [name] reported being prescribed medication in the past; however denied current use. [name] reported participating in therapy in the past and found the support helpful. [name] reported coping skills that included walking/walking away once she is upset to collect her thoughts. CSW provided education regarding the baby blues period vs. perinatal mood disorders, discussed treatment and gave resources for mental health follow up if concerns arise.  CSW recommends self-evaluation during the postpartum time period using the New Mom Checklist from Postpartum Progress and encouraged [name] to contact a medical professional if symptoms are noted at any time. CSW assessed for safety with [name] SI/HI/DV;[name] denied all.  CSW  asked [name] prior to being incarcerated were neccessary items for the infant obtained; [name] said no.  CSW asked [name] about the barriers she experienced with not obtaining PNC during the pregnancy. [name] reported not being stable prior to being incarcerated (June 13 2023), and not having reliable transportation to attend appointments. [name] admitted using substances during pregnancy which included Meth and THC. [name] reported using Meth and THC for the last couple years and her last use was 2 weeks ago. Per chart review [name] visited the MAU and tested positive for amphetamines 06-14-2023.  CSW has completed a CPS report with [place] intake [name] and awaiting for a response with assigned Child psychotherapist  CSW Plan/Description:    No Further Intervention Required/No Barriers to Discharge, Sudden Infant Death Syndrome (SIDS) Education, Perinatal Mood and Anxiety Disorder (PMADs) Education, [place] Drug Screen Policy Information, Other Information/Referral to [facility], CSW Will Continue to Monitor Umbilical Cord Tissue Drug Screen Results and Make Report if Monica [place]ding[place], LCSW 06/23/2023, 12:25 PM

## 2023-06-23 NOTE — Progress Notes (Signed)
CSW received a update from CPS social worker Loyal Gambler regarding the infant's discharge plan. Per the Loyal Gambler the aunt Evonnie Pat will be willing to care for the infant upon discharge.  Per Loyal Gambler she is on her way to the hospital to complete an assessment with [name].   CSW will update once she has been delivered to the room with [name] and the infant.  Enos Fling, LCSWA Clinical Social Worker [phone]   Loyal Gambler CPS Social worker [phone]

## 2023-06-23 NOTE — Progress Notes (Signed)
Post Partum Day 1 Subjective: no complaints, up ad lib, voiding, and tolerating PO.  Denies HA, vision changes or epigastric pain.   Objective: Blood pressure 127/77, pulse 90, temperature 98.3 F (36.8 C), resp. rate 16, SpO2 99%, unknown if currently breastfeeding.  Physical Exam:  General: alert, cooperative, appears older than stated age, and no distress Lochia: appropriate Uterine Fundus: firm Incision: NA DVT Evaluation: No evidence of DVT seen on physical exam.  Recent Labs    06/22/23 1254 06/23/23 0422  HGB 13.5 9.0*  HCT 44.4 26.8*    Assessment/Plan: Plan for discharge tomorrow, Social Work consult, and Contraception Depo  Anemia - PO Iron   Incarceration - Pt will be D/C back to prison due to not being able to post bail.   Substance Use Discorder - CPS report made by SW.  - Pt motivated to D/C substances, Message sent to F/U PP w/ [facility] clinic.   GHTN:  - Lasix - Procardia 30 XL QD   LOS: 1 day   [place], CNM 06/23/2023, 1:08 PM

## 2023-06-24 ENCOUNTER — Other Ambulatory Visit (HOSPITAL_COMMUNITY): Payer: Self-pay

## 2023-06-24 LAB — SURGICAL PATHOLOGY

## 2023-06-24 MED ORDER — FERROUS SULFATE 325 (65 FE) MG PO TABS
325.0000 mg | ORAL_TABLET | Freq: Every day | ORAL | 0 refills | Status: AC
  Filled 2023-06-24: qty 30, 30d supply, fill #0

## 2023-06-24 MED ORDER — IBUPROFEN 600 MG PO TABS
600.0000 mg | ORAL_TABLET | Freq: Four times a day (QID) | ORAL | 0 refills | Status: AC
  Filled 2023-06-24: qty 30, 8d supply, fill #0

## 2023-06-24 MED ORDER — FUROSEMIDE 20 MG PO TABS
20.0000 mg | ORAL_TABLET | Freq: Every day | ORAL | 0 refills | Status: AC
  Filled 2023-06-24: qty 3, 3d supply, fill #0

## 2023-06-24 MED ORDER — NIFEDIPINE ER 30 MG PO TB24
30.0000 mg | ORAL_TABLET | Freq: Every day | ORAL | 0 refills | Status: AC
  Filled 2023-06-24: qty 30, 30d supply, fill #0

## 2023-06-24 NOTE — Social Work (Signed)
CFT meeting complete, Per [facility] CPS, infant will be discharged to [name]'s Aunt [name]. CSW will make contact with [name]'s Aunt once [name] has been discharged and is off the premises.  [name], LCSWA Clinical Social Worker [phone]

## 2023-07-02 ENCOUNTER — Encounter: Admitting: Family Medicine

## 2024-05-18 ENCOUNTER — Encounter (HOSPITAL_COMMUNITY): Payer: Self-pay

## 2024-05-18 ENCOUNTER — Emergency Department (HOSPITAL_COMMUNITY)

## 2024-05-18 ENCOUNTER — Other Ambulatory Visit: Payer: Self-pay

## 2024-05-18 ENCOUNTER — Emergency Department (HOSPITAL_COMMUNITY): Admission: EM | Admit: 2024-05-18 | Discharge: 2024-05-18 | Disposition: A | Discharge status: Home / Self Care

## 2024-05-18 DIAGNOSIS — R5383 Other fatigue: Secondary | ICD-10-CM | POA: Diagnosis not present

## 2024-05-18 DIAGNOSIS — N939 Abnormal uterine and vaginal bleeding, unspecified: Secondary | ICD-10-CM | POA: Diagnosis not present

## 2024-05-18 DIAGNOSIS — B351 Tinea unguium: Secondary | ICD-10-CM | POA: Diagnosis not present

## 2024-05-18 DIAGNOSIS — M7989 Other specified soft tissue disorders: Secondary | ICD-10-CM | POA: Diagnosis present

## 2024-05-18 LAB — COMPREHENSIVE METABOLIC PANEL WITH GFR
ALT: 12 U/L (ref 0–44)
AST: 21 U/L (ref 15–41)
Albumin: 3.8 g/dL (ref 3.5–5.0)
Alkaline Phosphatase: 47 U/L (ref 38–126)
Anion gap: 9 (ref 5–15)
BUN: 8 mg/dL (ref 6–20)
CO2: 25 mmol/L (ref 22–32)
Calcium: 8.7 mg/dL — ABNORMAL LOW (ref 8.9–10.3)
Chloride: 102 mmol/L (ref 98–111)
Creatinine, Ser: 0.79 mg/dL (ref 0.44–1.00)
GFR, Estimated: >60 mL/min (ref >60)
Glucose, Bld: 99 mg/dL (ref 70–99)
Potassium: 3.3 mmol/L — ABNORMAL LOW (ref 3.5–5.1)
Sodium: 136 mmol/L (ref 135–145)
Total Bilirubin: 0.3 mg/dL (ref 0.0–1.2)
Total Protein: 6.6 g/dL (ref 6.5–8.1)

## 2024-05-18 LAB — CBC WITH DIFFERENTIAL/PLATELET
Abs Immature Granulocytes: 0.01 K/uL (ref 0.00–0.07)
Basophils Absolute: 0 K/uL (ref 0.0–0.1)
Basophils Relative: 1 %
Eosinophils Absolute: 0.4 K/uL (ref 0.0–0.5)
Eosinophils Relative: 7 %
HCT: 38.7 % (ref 36.0–46.0)
Hemoglobin: 12.8 g/dL (ref 12.0–15.0)
Immature Granulocytes: 0 %
Lymphocytes Relative: 40 %
Lymphs Abs: 2.3 K/uL (ref 0.7–4.0)
MCH: 28.8 pg (ref 26.0–34.0)
MCHC: 33.1 g/dL (ref 30.0–36.0)
MCV: 87.2 fL (ref 80.0–100.0)
Monocytes Absolute: 0.6 K/uL (ref 0.1–1.0)
Monocytes Relative: 11 %
Neutro Abs: 2.4 K/uL (ref 1.7–7.7)
Neutrophils Relative %: 41 %
Platelets: 291 K/uL (ref 150–400)
RBC: 4.44 MIL/uL (ref 3.87–5.11)
RDW: 11.4 % — ABNORMAL LOW (ref 11.5–15.5)
WBC: 5.7 K/uL (ref 4.0–10.5)
nRBC: 0 % (ref 0.0–0.2)

## 2024-05-18 LAB — HCG, SERUM, QUALITATIVE: Preg, Serum: NEGATIVE

## 2024-05-18 LAB — MAGNESIUM: Magnesium: 1.8 mg/dL (ref 1.7–2.4)

## 2024-05-18 LAB — TSH: TSH: 0.612 u[IU]/mL (ref 0.350–4.500)

## 2024-05-18 MED ORDER — POTASSIUM CHLORIDE CRYS ER 20 MEQ PO TBCR
40.0000 meq | EXTENDED_RELEASE_TABLET | Freq: Once | ORAL | Status: AC
  Administered 2024-05-18: 40 meq via ORAL
  Filled 2024-05-18: qty 2

## 2024-05-18 NOTE — ED Provider Notes (Signed)
 [place] EMERGENCY DEPARTMENT AT [facility] Provider Note   CSN: [id number] Arrival date & time: 05/18/24  1435     Patient presents with: Foot Swelling   [name] is a 33 y.o. female.   33 year old female with no reported past medical history presents the emergency department today with multiple complaints.  The patient states that she was recently incarcerated and just got out.  She states that she has been having some intermittent leg and hand swelling that has been going on now for the past few months.  Also reports that she has had abnormal vaginal bleeding during this time as well.  The patient states that she did have extensive workup while she was incarcerated including ultrasounds and was on Provera  in the past which did seem to work some.  She states that she does have an IUD.  Reports that she does have intermittent bleeding.  She reports that she is only having very minimal bleeding now.  She reports fatigue but denies any chest pain or lightheadedness.  She basically came to the emergency department today and is requesting referrals for further evaluation/workup.        Prior to Admission medications   Medication Sig Start Date End Date Taking? Authorizing Provider  ferrous sulfate  325 (65 FE) MG tablet Take 1 tablet (325 mg total) by mouth daily. 06/24/23   Marlee Agent NOVAK, MD  furosemide  (LASIX ) 20 MG tablet Take 1 tablet (20 mg total) by mouth daily. 06/24/23   Marlee Agent NOVAK, MD  ibuprofen  (ADVIL ) 600 MG tablet Take 1 tablet (600 mg total) by mouth every 6 (six) hours. 06/24/23   Marlee Agent NOVAK, MD  NIFEdipine  (ADALAT  CC) 30 MG 24 hr tablet Take 1 tablet (30 mg total) by mouth daily. 06/24/23   Marlee Agent NOVAK, MD    Allergies: Patient has no known allergies.    Review of Systems  Cardiovascular:  Positive for leg swelling.  Genitourinary:  Positive for vaginal bleeding.  All other systems reviewed and are negative.   Updated Vital  Signs BP (!) 120/54 (BP Location: Right Arm)   Pulse 71   Temp 98 F (36.7 C) (Oral)   Resp 16   Ht 5' 5 (1.651 m)   SpO2 99%   BMI 28.57 kg/m   Physical Exam Vitals and nursing note reviewed.   Gen: NAD Eyes: PERRL, EOMI HEENT: no oropharyngeal swelling Neck: trachea midline Resp: clear to auscultation bilaterally Card: RRR, no murmurs, rubs, or gallops Abd: nontender, nondistended Extremities: no calf tenderness, no pitting edema noted Vascular: 2+ radial pulses bilaterally, 2+ DP pulses bilaterally Skin: no rashes, fungal nail infections noted on bilateral feet Psyc: acting appropriately   (all labs ordered are listed, but only abnormal results are displayed) Labs Reviewed  CBC WITH DIFFERENTIAL/PLATELET - Abnormal; Notable for the following components:      Result Value   RDW 11.4 (*)    All other components within normal limits  COMPREHENSIVE METABOLIC PANEL WITH GFR - Abnormal; Notable for the following components:   Potassium 3.3 (*)    Calcium  8.7 (*)    All other components within normal limits  MAGNESIUM   TSH  HCG, SERUM, QUALITATIVE    EKG: None  Radiology: DG Chest Portable 1 View Result Date: 05/18/2024 CLINICAL DATA:  Upper and lower extremity swelling, fatigue EXAM: PORTABLE CHEST 1 VIEW COMPARISON:  11/23/2020 FINDINGS: The heart size and mediastinal contours are within normal limits. Both lungs are clear. The  visualized skeletal structures are unremarkable. IMPRESSION: No active disease. Electronically Signed   By: Ozell Daring M.D.   On: 05/18/2024 17:13     Procedures   Medications Ordered in the ED  potassium chloride  SA (KLOR-CON  M) CR tablet 40 mEq (40 mEq Oral Given 05/18/24 1922)                                    Medical Decision Making 33 year old female with no reported past medical history presenting to the emergency department today with multiple chronic complaints.  Will further evaluate patient here with basic labs to  evaluate for anemia given the bleeding.  Will also obtain thyroid function testing here as well as to evaluate her renal function.  I will also obtain a chest x-ray to for pulmonary edema given the swelling complaint.  Given duration of symptoms and bilateral nature with reassuring exam here with no significant obvious swelling noted I do not think that this is due to DVT.  The patient is otherwise well-appearing with stable vital signs.  If her workup is unremarkable we will refer her to the [facility] as well as a new primary care provider for further evaluation for these chronic problems but I do not think that further workup on the emergent setting is warranted at this time.  The patient's work appears reassuring.  She is discharged with return precautions.  Amount and/or Complexity of Data Reviewed Labs: ordered. Radiology: ordered.  Risk Prescription drug management.        Final diagnoses:  Onychomycosis of toenail  Abnormal uterine bleeding  Other fatigue    ED Discharge Orders     None          Ula Prentice SAUNDERS, MD 05/18/24 1941

## 2024-05-18 NOTE — Discharge Instructions (Addendum)
 Your workup today was reassuring.  Please call and schedule a follow-up appointment with the women's Center at the number provided and also call the physician referral line for a new primary care provider.  They should be able to workup and treat your symptoms that you came in with today.  Please return to the emergency department if you are bleeding through more than 1 pad per hour or with other concerning symptoms.

## 2024-05-18 NOTE — ED Triage Notes (Signed)
 Patient reports cramping and pain in her hands and her feet hurt when she walks and stands. She feels like it is carpel tunnel.

## 2024-05-18 NOTE — ED Triage Notes (Signed)
 Pt states that she was released from prison about a month ago and would like to be re-evaluated due to hand and foot swelling as well as thyroid enlargement and vaginal bleeding and fatigue for which she was evaluated while incarcerated but nothing was found.

## 2024-05-18 NOTE — ED Notes (Signed)
 Extra SST & Red tube drawn

## 2024-05-20 ENCOUNTER — Other Ambulatory Visit: Payer: Self-pay

## 2024-05-20 ENCOUNTER — Emergency Department (HOSPITAL_COMMUNITY)

## 2024-05-20 ENCOUNTER — Encounter (HOSPITAL_COMMUNITY): Payer: Self-pay | Admitting: *Deleted

## 2024-05-20 ENCOUNTER — Emergency Department (HOSPITAL_COMMUNITY)
Admission: EM | Admit: 2024-05-20 | Discharge: 2024-05-20 | Disposition: A | Discharge status: Home / Self Care | Attending: Emergency Medicine | Admitting: Emergency Medicine

## 2024-05-20 DIAGNOSIS — S161XXA Strain of muscle, fascia and tendon at neck level, initial encounter: Secondary | ICD-10-CM | POA: Diagnosis not present

## 2024-05-20 DIAGNOSIS — Y92410 Unspecified street and highway as the place of occurrence of the external cause: Secondary | ICD-10-CM | POA: Insufficient documentation

## 2024-05-20 DIAGNOSIS — S199XXA Unspecified injury of neck, initial encounter: Secondary | ICD-10-CM | POA: Diagnosis present

## 2024-05-20 DIAGNOSIS — R200 Anesthesia of skin: Secondary | ICD-10-CM

## 2024-05-20 DIAGNOSIS — R202 Paresthesia of skin: Secondary | ICD-10-CM | POA: Insufficient documentation

## 2024-05-20 MED ORDER — PREDNISONE 10 MG (21) PO TBPK: ORAL_TABLET | Freq: Every day | ORAL | 0 refills | Status: AC

## 2024-05-20 MED ORDER — LORAZEPAM 1 MG PO TABS
1.0000 mg | ORAL_TABLET | Freq: Once | ORAL | Status: AC
  Administered 2024-05-20: 1 mg via ORAL
  Filled 2024-05-20: qty 1

## 2024-05-20 MED ORDER — NAPROXEN 250 MG PO TABS
500.0000 mg | ORAL_TABLET | Freq: Once | ORAL | Status: AC
  Administered 2024-05-20: 500 mg via ORAL
  Filled 2024-05-20: qty 2
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
## 2024-05-20 NOTE — Discharge Instructions (Addendum)
 Please start taking the steroids that are prescribed. The MRI does not show any evidence of fracture or spinal cord injury.  Call the neurosurgeon at the number provided if needed.

## 2024-06-05 ENCOUNTER — Telehealth: Payer: Self-pay | Admitting: General Practice

## 2024-06-05 NOTE — Telephone Encounter (Signed)
-----   Message from Rollene FORBES Keeling sent at 06/05/2024 11:52 AM EST ----- Regarding: RE: SUD patient Awesome thanks Milda.  Tobey, can you please call this patient to schedule her at her convenience with Dr Orie for a new patient visit!  Margo ----- Message ----- From: Orie Milda CROME, MD Sent: 06/05/2024   8:11 AM EST To: Laymon JINNY Legions, MD; Suzann CHRISTELLA Daring, MD;# Subject: RE: SUD patient                                Yes, I'd be happy to see her and any other folks that need SUD. Thanks! ----- Message ----- From: Lola Donnice CHRISTELLA, MD Sent: 06/02/2024   3:38 PM EST To: Laymon JINNY Legions, MD; Suzann CHRISTELLA Daring, MD;# Subject: RE: SUD patient                                I love that! It's a tremendous help to have partners to refer to after postpartum that I know will care for and take good care of my patients.   Thanks ya'll!  Mateo ----- Message ----- From: Keeling Rollene FORBES, MD Sent: 06/02/2024   1:52 PM EST To: Laymon JINNY Legions, MD; Suzann CHRISTELLA Daring, MD;# Subject: RE: SUD patient                                Erskin Lopes,  We would love to take her. I added on here [employer] our newest who was just saying earlier today she wanted more suboxone patients and she is leading the suboxone teaching now at the [facility], I think I introduced yall in the hallway one day.  Sonda- would you be willing to see her? If not I definitely can!  I met one of your other transferred patients today Lopes and she is lovely!  Margo ----- Message ----- From: Lola Donnice CHRISTELLA, MD Sent: 06/02/2024  11:28 AM EST To: Laymon JINNY Legions, MD; Suzann CHRISTELLA Daring, MD;# Subject: SUD patient                                    [place] [facility] colleagues,  The lead clinician at [facility] got in touch with me, this patient is non-pregnant but transitioning from incarceration and really in need of a provider to manage her suboxone, can one of ya'll take her on?  Thanks [employer]

## 2024-06-05 NOTE — Telephone Encounter (Signed)
 Called patient to schedule appointment, unable to LVM as it was full. If patient calls back please assist in scheduling new patient appointment with Dr. Orie.   Thanks!
